# Patient Record
Sex: Female | Born: 1995
Health system: Southern US, Community
[De-identification: ages and names within clinical notes are randomized; demographics above are authoritative.]

## PROBLEM LIST (undated history)

## (undated) DIAGNOSIS — K219 Gastro-esophageal reflux disease without esophagitis: Secondary | ICD-10-CM

## (undated) DIAGNOSIS — L709 Acne, unspecified: Secondary | ICD-10-CM

## (undated) DIAGNOSIS — J45909 Unspecified asthma, uncomplicated: Secondary | ICD-10-CM

## (undated) DIAGNOSIS — M419 Scoliosis, unspecified: Secondary | ICD-10-CM

## (undated) HISTORY — DX: Gastro-esophageal reflux disease without esophagitis: K21.9

## (undated) HISTORY — DX: Scoliosis, unspecified: M41.9

## (undated) HISTORY — PX: TOOTH EXTRACTION: SHX859

## (undated) HISTORY — PX: TYMPANOSTOMY TUBE PLACEMENT: SHX32

## (undated) HISTORY — DX: Unspecified asthma, uncomplicated: J45.909

---
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
## 2014-03-09 ENCOUNTER — Telehealth: Payer: Self-pay

## 2014-03-09 NOTE — Telephone Encounter (Signed)
Left message for call back Non-identifiable   Pap Flu Td- 07/12/07

## 2014-03-10 ENCOUNTER — Encounter: Payer: Self-pay | Admitting: Family Medicine

## 2014-03-10 ENCOUNTER — Ambulatory Visit (INDEPENDENT_AMBULATORY_CARE_PROVIDER_SITE_OTHER): Payer: 59 | Admitting: Family Medicine

## 2014-03-10 VITALS — BP 106/68 | HR 97 | Temp 98.1°F | Ht 64.5 in | Wt 131.0 lb

## 2014-03-10 DIAGNOSIS — J45990 Exercise induced bronchospasm: Secondary | ICD-10-CM

## 2014-03-10 DIAGNOSIS — R7989 Other specified abnormal findings of blood chemistry: Secondary | ICD-10-CM

## 2014-03-10 DIAGNOSIS — Z0000 Encounter for general adult medical examination without abnormal findings: Secondary | ICD-10-CM

## 2014-03-10 LAB — POCT URINALYSIS DIPSTICK
BILIRUBIN UA: NEGATIVE
Glucose, UA: NEGATIVE
KETONES UA: NEGATIVE
Leukocytes, UA: NEGATIVE
Nitrite, UA: NEGATIVE
PH UA: 8
Protein, UA: NEGATIVE
RBC UA: NEGATIVE
SPEC GRAV UA: 1.01
Urobilinogen, UA: 0.2

## 2014-03-10 LAB — RPR

## 2014-03-10 MED ORDER — ALBUTEROL SULFATE HFA 108 (90 BASE) MCG/ACT IN AERS: 2.0000 | INHALATION_SPRAY | Freq: Four times a day (QID) | RESPIRATORY_TRACT | Status: DC | PRN

## 2014-03-10 MED ORDER — BECLOMETHASONE DIPROPIONATE 40 MCG/ACT IN AERS: 2.0000 | INHALATION_SPRAY | Freq: Two times a day (BID) | RESPIRATORY_TRACT | Status: DC

## 2014-03-10 NOTE — Progress Notes (Signed)
  Subjective:     Haley Combs is a 18 y.o. female and is here for a comprehensive physical exam. The patient reports no problems.  History   Social History  . Marital Status: Single    Spouse Name: N/A    Number of Children: 0  . Years of Education: N/A   Occupational History  . Student      [employer]   Social History Main Topics  . Smoking status: Never Smoker   . Smokeless tobacco: Never Used  . Alcohol Use: No  . Drug Use: No  . Sexual Activity: Not Currently    Partners: Male    Birth Control/ Protection: Condom, Pill   Other Topics Concern  . Not on file   Social History Narrative   Exercise-- y 3days a week   Rare caffeine    Health Maintenance  Topic Date Due  . Pap Smear  03/10/2016 (Originally 01/20/2013)  . Tetanus/tdap  03/10/2017 (Originally 01/20/2014)  . Influenza Vaccine  06/20/2014    The following portions of the patient's history were reviewed and updated as appropriate: allergies, current medications, past family history, past medical history, past social history, past surgical history and problem list.  Review of Systems Review of Systems  Constitutional: Negative for activity change, appetite change and fatigue.  HENT: Negative for hearing loss, congestion, tinnitus and ear discharge.  dentist q484m Eyes: Negative for visual disturbance (see optho q1y -- vision corrected to 20/20 with glasses).  Respiratory: Negative for cough, chest tightness and shortness of breath.   Cardiovascular: Negative for chest pain, palpitations and leg swelling.  Gastrointestinal: Negative for abdominal pain, diarrhea, constipation and abdominal distention.  Genitourinary: Negative for urgency, frequency, decreased urine volume and difficulty urinating.  Musculoskeletal: Negative for back pain, arthralgias and gait problem.  Skin: Negative for color change, pallor and rash.  Neurological: Negative for dizziness, light-headedness, numbness and headaches.   Hematological: Negative for adenopathy. Does not bruise/bleed easily.  Psychiatric/Behavioral: Negative for suicidal ideas, confusion, sleep disturbance, self-injury, dysphoric mood, decreased concentration and agitation.       Objective:    BP 106/68  Pulse 97  Temp(Src) 98.1 F (36.7 C) (Oral)  Ht 5' 4.5" (1.638 m)  Wt 131 lb (59.421 kg)  BMI 22.15 kg/m2  SpO2 96%  LMP 02/22/2014 General appearance: alert, cooperative, appears stated age and no distress Head: Normocephalic, without obvious abnormality, atraumatic Eyes: conjunctivae/corneas clear. PERRL, EOM's intact. Fundi benign. Ears: normal TM's and external ear canals both ears Nose: Nares normal. Septum midline. Mucosa normal. No drainage or sinus tenderness. Throat: lips, mucosa, and tongue normal; teeth and gums normal Neck: no adenopathy, no carotid bruit, no JVD, supple, symmetrical, trachea midline and thyroid not enlarged, symmetric, no tenderness/mass/nodules Back: scoliosis Lungs: clear to auscultation bilaterally Breasts: gyn Heart: regular rate and rhythm, S1, S2 normal, no murmur, click, rub or gallop Abdomen: soft, non-tender; bowel sounds normal; no masses,  no organomegaly Pelvic: gyn Extremities: extremities normal, atraumatic, no cyanosis or edema Pulses: 2+ and symmetric Skin: Skin color, texture, turgor normal. No rashes or lesions Lymph nodes: Cervical, supraclavicular, and axillary nodes normal. Neurologic: Alert and oriented X 3, normal strength and tone. Normal symmetric reflexes. Normal coordination and gait Psych-- no depression, no anxiety      Assessment:    Healthy female exam.     Plan:     ghm utd  Check labs  See After Visit Summary for Counseling Recommendations

## 2014-03-10 NOTE — Patient Instructions (Signed)

## 2014-03-10 NOTE — Progress Notes (Signed)
Pre visit review using our clinic review tool, if applicable. No additional management support is needed unless otherwise documented below in the visit note. 

## 2014-03-11 ENCOUNTER — Encounter: Payer: Self-pay | Admitting: Family Medicine

## 2014-03-11 LAB — TSH: TSH: 1.29 u[IU]/mL (ref 0.35–5.50)

## 2014-03-11 LAB — GC/CHLAMYDIA PROBE AMP, URINE
CHLAMYDIA, SWAB/URINE, PCR: NEGATIVE
GC PROBE AMP, URINE: NEGATIVE

## 2014-03-11 LAB — CBC WITH DIFFERENTIAL/PLATELET
BASOS PCT: 0.3 % (ref 0.0–3.0)
Basophils Absolute: 0 10*3/uL (ref 0.0–0.1)
EOS ABS: 0.1 10*3/uL (ref 0.0–0.7)
Eosinophils Relative: 1.3 % (ref 0.0–5.0)
HCT: 42 % (ref 36.0–46.0)
Hemoglobin: 14.3 g/dL (ref 12.0–15.0)
LYMPHS ABS: 1.7 10*3/uL (ref 0.7–4.0)
Lymphocytes Relative: 25 % (ref 12.0–46.0)
MCHC: 33.9 g/dL (ref 30.0–36.0)
MCV: 93.2 fl (ref 78.0–100.0)
MONO ABS: 0.3 10*3/uL (ref 0.1–1.0)
Monocytes Relative: 4.2 % (ref 3.0–12.0)
NEUTROS PCT: 69.2 % (ref 43.0–77.0)
Neutro Abs: 4.7 10*3/uL (ref 1.4–7.7)
PLATELETS: 318 10*3/uL (ref 150.0–400.0)
RBC: 4.51 Mil/uL (ref 3.87–5.11)
RDW: 13.3 % (ref 11.5–14.6)
WBC: 6.8 10*3/uL (ref 4.5–10.5)

## 2014-03-11 LAB — LIPID PANEL
CHOL/HDL RATIO: 4
CHOLESTEROL: 185 mg/dL (ref 0–200)
HDL: 49.3 mg/dL (ref >39.00)
LDL Cholesterol: 115 mg/dL — ABNORMAL HIGH (ref 0–99)
TRIGLYCERIDES: 104 mg/dL (ref 0.0–149.0)
VLDL: 20.8 mg/dL (ref 0.0–40.0)

## 2014-03-11 LAB — BASIC METABOLIC PANEL
BUN: 12 mg/dL (ref 6–23)
CHLORIDE: 104 meq/L (ref 96–112)
CO2: 25 meq/L (ref 19–32)
CREATININE: 1 mg/dL (ref 0.4–1.2)
Calcium: 9.4 mg/dL (ref 8.4–10.5)
GFR: 75.8 mL/min (ref >60.00)
Glucose, Bld: 78 mg/dL (ref 70–99)
Potassium: 3.9 mEq/L (ref 3.5–5.1)
Sodium: 137 mEq/L (ref 135–145)

## 2014-03-11 LAB — HEPATIC FUNCTION PANEL
ALT: 17 U/L (ref 0–35)
AST: 23 U/L (ref 0–37)
Albumin: 4.3 g/dL (ref 3.5–5.2)
Alkaline Phosphatase: 36 U/L — ABNORMAL LOW (ref 39–117)
BILIRUBIN DIRECT: 0 mg/dL (ref 0.0–0.3)
TOTAL PROTEIN: 7.6 g/dL (ref 6.0–8.3)
Total Bilirubin: 0.6 mg/dL (ref 0.3–1.2)

## 2014-03-11 LAB — TESTOSTERONE, FREE, TOTAL, SHBG
Sex Hormone Binding: 69 nmol/L (ref 18–114)
TESTOSTERONE: 54 ng/dL — AB (ref 15–40)
Testosterone, Free: 5.9 pg/mL (ref 0.6–6.8)
Testosterone-% Free: 1.1 % (ref 0.4–2.4)

## 2014-03-11 LAB — HSV 2 ANTIBODY, IGG: HSV 2 GLYCOPROTEIN G AB, IGG: 0.25 IV

## 2014-03-11 LAB — HIV ANTIBODY (ROUTINE TESTING W REFLEX): HIV 1&2 Ab, 4th Generation: NONREACTIVE

## 2014-03-16 NOTE — Telephone Encounter (Signed)
Unable to reach pre visit.  

## 2015-02-23 ENCOUNTER — Telehealth: Payer: Self-pay | Admitting: Family Medicine

## 2015-02-23 NOTE — Telephone Encounter (Signed)
Pre visit letter sent  °

## 2015-03-12 ENCOUNTER — Emergency Department (HOSPITAL_BASED_OUTPATIENT_CLINIC_OR_DEPARTMENT_OTHER): Payer: 59

## 2015-03-12 ENCOUNTER — Encounter (HOSPITAL_BASED_OUTPATIENT_CLINIC_OR_DEPARTMENT_OTHER): Payer: Self-pay | Admitting: *Deleted

## 2015-03-12 ENCOUNTER — Emergency Department (HOSPITAL_BASED_OUTPATIENT_CLINIC_OR_DEPARTMENT_OTHER)
Admission: EM | Admit: 2015-03-12 | Discharge: 2015-03-12 | Disposition: A | Discharge status: Home / Self Care | Payer: 59 | Attending: Emergency Medicine | Admitting: Emergency Medicine

## 2015-03-12 ENCOUNTER — Encounter: Payer: 59 | Admitting: Family Medicine

## 2015-03-12 DIAGNOSIS — R2241 Localized swelling, mass and lump, right lower limb: Secondary | ICD-10-CM | POA: Insufficient documentation

## 2015-03-12 DIAGNOSIS — Z872 Personal history of diseases of the skin and subcutaneous tissue: Secondary | ICD-10-CM | POA: Insufficient documentation

## 2015-03-12 DIAGNOSIS — M79661 Pain in right lower leg: Secondary | ICD-10-CM | POA: Insufficient documentation

## 2015-03-12 DIAGNOSIS — Z7952 Long term (current) use of systemic steroids: Secondary | ICD-10-CM | POA: Diagnosis not present

## 2015-03-12 DIAGNOSIS — Z793 Long term (current) use of hormonal contraceptives: Secondary | ICD-10-CM | POA: Insufficient documentation

## 2015-03-12 DIAGNOSIS — Z8719 Personal history of other diseases of the digestive system: Secondary | ICD-10-CM | POA: Diagnosis not present

## 2015-03-12 DIAGNOSIS — M79604 Pain in right leg: Secondary | ICD-10-CM

## 2015-03-12 DIAGNOSIS — J45909 Unspecified asthma, uncomplicated: Secondary | ICD-10-CM | POA: Insufficient documentation

## 2015-03-12 LAB — BASIC METABOLIC PANEL
Anion gap: 8 (ref 5–15)
BUN: 10 mg/dL (ref 6–23)
CHLORIDE: 103 mmol/L (ref 96–112)
CO2: 26 mmol/L (ref 19–32)
CREATININE: 0.8 mg/dL (ref 0.50–1.10)
Calcium: 9.3 mg/dL (ref 8.4–10.5)
GFR calc non Af Amer: >90 mL/min (ref >90)
GLUCOSE: 121 mg/dL — AB (ref 70–99)
POTASSIUM: 3.3 mmol/L — AB (ref 3.5–5.1)
Sodium: 137 mmol/L (ref 135–145)

## 2015-03-12 LAB — CBC WITH DIFFERENTIAL/PLATELET
BASOS ABS: 0 10*3/uL (ref 0.0–0.1)
Basophils Relative: 0 % (ref 0–1)
EOS PCT: 1 % (ref 0–5)
Eosinophils Absolute: 0.1 10*3/uL (ref 0.0–0.7)
HEMATOCRIT: 42.7 % (ref 36.0–46.0)
HEMOGLOBIN: 14.4 g/dL (ref 12.0–15.0)
Lymphocytes Relative: 29 % (ref 12–46)
Lymphs Abs: 1.9 10*3/uL (ref 0.7–4.0)
MCH: 31.2 pg (ref 26.0–34.0)
MCHC: 33.7 g/dL (ref 30.0–36.0)
MCV: 92.6 fL (ref 78.0–100.0)
MONO ABS: 0.4 10*3/uL (ref 0.1–1.0)
MONOS PCT: 6 % (ref 3–12)
NEUTROS ABS: 4.3 10*3/uL (ref 1.7–7.7)
Neutrophils Relative %: 64 % (ref 43–77)
Platelets: 286 10*3/uL (ref 150–400)
RBC: 4.61 MIL/uL (ref 3.87–5.11)
RDW: 12.5 % (ref 11.5–15.5)
WBC: 6.6 10*3/uL (ref 4.0–10.5)

## 2015-03-12 NOTE — ED Notes (Signed)
Patient transported to Ultrasound 

## 2015-03-12 NOTE — ED Notes (Signed)
[facility]

## 2015-03-12 NOTE — Discharge Instructions (Signed)
Pain of Unknown Etiology (Pain Without a Known Cause) °You have come to your caregiver because of pain. Pain can occur in any part of the body. Often there is not a definite cause. If your laboratory (blood or urine) work was normal and X-rays or other studies were normal, your caregiver may treat you without knowing the cause of the pain. An example of this is the headache. Most headaches are diagnosed by taking a history. This means your caregiver asks you questions about your headaches. Your caregiver determines a treatment based on your answers. Usually testing done for headaches is normal. Often testing is not done unless there is no response to medications. Regardless of where your pain is located today, you can be given medications to make you comfortable. If no physical cause of pain can be found, most cases of pain will gradually leave as suddenly as they came.  °If you have a painful condition and no reason can be found for the pain, it is important that you follow up with your caregiver. If the pain becomes worse or does not go away, it may be necessary to repeat tests and look further for a possible cause. °· Only take over-the-counter or prescription medicines for pain, discomfort, or fever as directed by your caregiver. °· For the protection of your privacy, test results cannot be given over the phone. Make sure you receive the results of your test. Ask how these results are to be obtained if you have not been informed. It is your responsibility to obtain your test results. °· You may continue all activities unless the activities cause more pain. When the pain lessens, it is important to gradually resume normal activities. Resume activities by beginning slowly and gradually increasing the intensity and duration of the activities or exercise. During periods of severe pain, bed rest may be helpful. Lie or sit in any position that is comfortable. °· Ice used for acute (sudden) conditions may be effective.  Use a large plastic bag filled with ice and wrapped in a towel. This may provide pain relief. °· See your caregiver for continued problems. Your caregiver can help or refer you for exercises or physical therapy if necessary. °If you were given medications for your condition, do not drive, operate machinery or power tools, or sign legal documents for 24 hours. Do not drink alcohol, take sleeping pills, or take other medications that may interfere with treatment. °See your caregiver immediately if you have pain that is becoming worse and not relieved by medications. °Document Released: 08/01/2001 Document Revised: 08/27/2013 Document Reviewed: 11/06/2005 °ExitCare® Patient Information ©2015 [employer]. This information is not intended to replace advice given to you by your health care provider. Make sure you discuss any questions you have with your health care provider. ° °

## 2015-03-12 NOTE — ED Provider Notes (Signed)
CSN: [id number]     Arrival date & time 03/12/15  1803 History   First MD Initiated Contact with Patient 03/12/15 1821     Chief Complaint  Patient presents with  . Leg Pain     (Consider location/radiation/quality/duration/timing/severity/associated sxs/prior Treatment) HPI Comments: Pt comes in with r/o right lateral lower leg pain. Pt states that she has noticed swelling to the area. Denies injury. Nothing makes the symptoms worse. Pt is on birth control. She was seen [facility] and sent over to r/o dvt. No sob or cp. No previous injury to the area. No family history of clotting disorder  No language interpreter was used.    Past Medical History  Diagnosis Date  . Acne   . Asthma     Weather Induced heat/cold  . GERD (gastroesophageal reflux disease)   . Scoliosis    Past Surgical History  Procedure Laterality Date  . Tooth extraction    . Tympanostomy tube placement     Family History  Problem Relation Age of Onset  . Hypertension Father     Not Medicated  . Hyperlipidemia Father   . Alcohol abuse Sister   . Drug abuse Sister   . Alcohol abuse Paternal Uncle   . Mental illness Paternal Uncle     Schizophrania  . Hypertension Paternal Grandmother   . Hypertension Paternal Grandfather   . Stroke Paternal Grandfather   . Heart disease Paternal Grandfather     Pacemaker, Ruptured Aorta  . Colon cancer Neg Hx   . Colon polyps Neg Hx   . Dementia Maternal Grandmother    History  Substance Use Topics  . Smoking status: Never Smoker   . Smokeless tobacco: Never Used  . Alcohol Use: No   OB History    No data available     Review of Systems  All other systems reviewed and are negative.     Allergies  Review of patient's allergies indicates no known allergies.  Home Medications   Prior to Admission medications   Medication Sig Start Date End Date Taking? Authorizing Provider  albuterol (PROAIR HFA) 108 (90 BASE) MCG/ACT inhaler Inhale 2 puffs into the  lungs every 6 (six) hours as needed for wheezing or shortness of breath. 03/10/14   Lelon Perla, DO  beclomethasone (QVAR) 40 MCG/ACT inhaler Inhale 2 puffs into the lungs 2 (two) times daily. 03/10/14   Yvonne R Lowne, DO  LEVORA 0.15/30, 28, 0.15-30 MG-MCG tablet Take 1 tablet by mouth daily. 01/27/13   Historical Provider, MD   BP 123/84 mmHg  Pulse 106  Temp(Src) 98.2 F (36.8 C) (Oral)  Resp 18  Ht  (1.651 m)  Wt 130 lb (58.968 kg)  BMI 21.63 kg/m2  SpO2 98%  LMP 02/26/2015 Physical Exam  Constitutional: She is oriented to person, place, and time. She appears well-developed and well-nourished.  Cardiovascular: Normal rate.   Pulmonary/Chest: Effort normal and breath sounds normal.  Musculoskeletal: Normal range of motion.  Mild swelling noted to the right lower lateral leg. No redness or warmth noted. Full rom. Tender to palpation.  Neurological: She is alert and oriented to person, place, and time. She exhibits normal muscle tone.  Skin: Skin is warm and dry.  Psychiatric: She has a normal mood and affect.  Nursing note and vitals reviewed.   ED Course  Procedures (including critical care time) Labs Review Labs Reviewed  BASIC METABOLIC PANEL - Abnormal; Notable for the following:    Potassium 3.3 (*)  Glucose, Bld 121 (*)    All other components within normal limits  CBC WITH DIFFERENTIAL/PLATELET    Imaging Review Dg Tibia/fibula Right  03/12/2015   CLINICAL DATA:  Right lower leg pain and swelling for 2 days. No known injury.  EXAM: RIGHT TIBIA AND FIBULA - 2 VIEW  COMPARISON:  None.  FINDINGS: There is no evidence of fracture or other focal bone lesions. Soft tissues are unremarkable.  IMPRESSION: Normal exam.   Electronically Signed   By: Francene BoyersJames  Maxwell M.D.   On: 03/12/2015 19:35   [place] Venous Img Lower Unilateral Right  03/12/2015   CLINICAL DATA:  Pain and swelling of the right lateral lower calf, erythema  EXAM: Right LOWER EXTREMITY VENOUS DOPPLER  ULTRASOUND  TECHNIQUE: Gray-scale sonography with graded compression, as well as color Doppler and duplex ultrasound were performed to evaluate the lower extremity deep venous systems from the level of the common femoral vein and including the common femoral, femoral, profunda femoral, popliteal and calf veins including the posterior tibial, peroneal and gastrocnemius veins when visible. The superficial great saphenous vein was also interrogated. Spectral Doppler was utilized to evaluate flow at rest and with distal augmentation maneuvers in the common femoral, femoral and popliteal veins.  COMPARISON:  None.  FINDINGS: Contralateral Common Femoral Vein: Respiratory phasicity is normal and symmetric with the symptomatic side. No evidence of thrombus. Normal compressibility.  Common Femoral Vein: No evidence of thrombus. Normal compressibility, respiratory phasicity and response to augmentation.  Saphenofemoral Junction: No evidence of thrombus. Normal compressibility and flow on color Doppler imaging.  Profunda Femoral Vein: No evidence of thrombus. Normal compressibility and flow on color Doppler imaging.  Femoral Vein: No evidence of thrombus. Normal compressibility, respiratory phasicity and response to augmentation.  Popliteal Vein: No evidence of thrombus. Normal compressibility, respiratory phasicity and response to augmentation.  Calf Veins: No evidence of thrombus. Normal compressibility and flow on color Doppler imaging.  Superficial Great Saphenous Vein: No evidence of thrombus. Normal compressibility and flow on color Doppler imaging.  Venous Reflux:  None.  Other Findings:  None.  IMPRESSION: No evidence of deep venous thrombosis.   Electronically Signed   By: Christiana PellantGretchen  Green M.D.   On: 03/12/2015 19:30     EKG Interpretation None      MDM   Final diagnoses:  Pain of right lower extremity    No definite explanation for the symptoms. Pt instruction on taking ibuprofen and follow up with her  doctor as needed.    Teressa LowerVrinda Brandace Cargle, NP 03/12/15 1951  Gwyneth SproutWhitney Plunkett, MD 03/12/15 [phone]

## 2015-03-12 NOTE — ED Notes (Signed)
[facility]. She has pain and swelling to her right lower leg.

## 2015-03-15 ENCOUNTER — Ambulatory Visit (INDEPENDENT_AMBULATORY_CARE_PROVIDER_SITE_OTHER): Payer: 59 | Admitting: Family Medicine

## 2015-03-15 ENCOUNTER — Encounter: Payer: Self-pay | Admitting: Family Medicine

## 2015-03-15 VITALS — BP 116/70 | HR 89 | Temp 98.0°F | Resp 18 | Ht 64.75 in | Wt 129.0 lb

## 2015-03-15 DIAGNOSIS — R739 Hyperglycemia, unspecified: Secondary | ICD-10-CM | POA: Diagnosis not present

## 2015-03-15 DIAGNOSIS — Z0000 Encounter for general adult medical examination without abnormal findings: Secondary | ICD-10-CM | POA: Diagnosis not present

## 2015-03-15 MED ORDER — NONFORMULARY OR COMPOUNDED ITEM: Status: DC

## 2015-03-15 NOTE — Progress Notes (Signed)
Subjective:     Haley Combs is a 19 y.o. female and is here for a comprehensive physical exam. The patient reports no problems.  History   Social History  . Marital Status: Single    Spouse Name: N/A  . Number of Children: 0  . Years of Education: N/A   Occupational History  . Student      [employer]   Social History Main Topics  . Smoking status: Never Smoker   . Smokeless tobacco: Never Used  . Alcohol Use: No  . Drug Use: No  . Sexual Activity:    Partners: Male    Birth Control/ Protection: Condom, Pill   Other Topics Concern  . Not on file   Social History Narrative   Exercise-- y 3days a week   Rare caffeine    Health Maintenance  Topic Date Due  . PAP SMEAR  03/10/2016 (Originally 01/20/2013)  . TETANUS/TDAP  03/10/2017 (Originally 01/20/2014)  . INFLUENZA VACCINE  06/21/2015  . HIV Screening  Completed    The following portions of the patient's history were reviewed and updated as appropriate:  She  has a past medical history of Acne; Asthma; GERD (gastroesophageal reflux disease); and Scoliosis. She  does not have any pertinent problems on file. She  has past surgical history that includes Tooth Extraction and Tympanostomy tube placement. Her family history includes Alcohol abuse in her paternal uncle and sister; Dementia in her maternal grandmother; Drug abuse in her sister; Heart disease in her paternal grandfather; Hyperlipidemia in her father; Hypertension in her father, paternal grandfather, and paternal grandmother; Mental illness in her paternal uncle; Stroke in her paternal grandfather. There is no history of Colon cancer or Colon polyps. She  reports that she has never smoked. She has never used smokeless tobacco. She reports that she does not drink alcohol or use illicit drugs. She has a current medication list which includes the following prescription(s): albuterol, levora 0.15/30 (28), beclomethasone, and NONFORMULARY OR COMPOUNDED ITEM. Current  Outpatient Prescriptions on File Prior to Visit  Medication Sig Dispense Refill  . albuterol (PROAIR HFA) 108 (90 BASE) MCG/ACT inhaler Inhale 2 puffs into the lungs every 6 (six) hours as needed for wheezing or shortness of breath. 1 Inhaler 5  . LEVORA 0.15/30, 28, 0.15-30 MG-MCG tablet Take 1 tablet by mouth daily.    . beclomethasone (QVAR) 40 MCG/ACT inhaler Inhale 2 puffs into the lungs 2 (two) times daily. (Patient not taking: Reported on 03/15/2015) 1 Inhaler 12   No current facility-administered medications on file prior to visit.   She has No Known Allergies..  Review of Systems Review of Systems  Constitutional: Negative for activity change, appetite change and fatigue.  HENT: Negative for hearing loss, congestion, tinnitus and ear discharge.  dentist [id number] Eyes: Negative for visual disturbance (see optho q1y -- vision corrected to 20/20 with glasses).  Respiratory: Negative for cough, chest tightness and shortness of breath.   Cardiovascular: Negative for chest pain, palpitations and leg swelling.  Gastrointestinal: Negative for abdominal pain, diarrhea, constipation and abdominal distention.  Genitourinary: Negative for urgency, frequency, decreased urine volume and difficulty urinating.  Musculoskeletal: Negative for back pain, arthralgias and gait problem.  Skin: Negative for color change, pallor and rash.  Neurological: Negative for dizziness, light-headedness, numbness and headaches.  Hematological: Negative for adenopathy. Does not bruise/bleed easily.  Psychiatric/Behavioral: Negative for suicidal ideas, confusion, sleep disturbance, self-injury, dysphoric mood, decreased concentration and agitation.       Objective:  BP 116/70 mmHg  Pulse 89  Temp(Src) 98 F (36.7 C) (Oral)  Resp 18  Ht 5' 4.75" (1.645 m)  Wt 129 lb (58.514 kg)  BMI 21.62 kg/m2  SpO2 99%  LMP 02/26/2015 General appearance: alert, cooperative, appears stated age and no distress Head:  Normocephalic, without obvious abnormality, atraumatic Eyes: conjunctivae/corneas clear. PERRL, EOM's intact. Fundi benign. Ears: normal TM's and external ear canals both ears Nose: Nares normal. Septum midline. Mucosa normal. No drainage or sinus tenderness. Throat: lips, mucosa, and tongue normal; teeth and gums normal Neck: no adenopathy, no carotid bruit, no JVD, supple, symmetrical, trachea midline and thyroid not enlarged, symmetric, no tenderness/mass/nodules Back: symmetric, no curvature. ROM normal. No CVA tenderness. Lungs: clear to auscultation bilaterally Breasts: gyn Heart: regular rate and rhythm, S1, S2 normal, no murmur, click, rub or gallop Abdomen: soft, non-tender; bowel sounds normal; no masses,  no organomegaly Pelvic: deferred--gyn Extremities: extremities normal, atraumatic, no cyanosis or edema Pulses: 2+ and symmetric Skin: Skin color, texture, turgor normal. No rashes or lesions Lymph nodes: Cervical, supraclavicular, and axillary nodes normal. Neurologic: Alert and oriented X 3, normal strength and tone. Normal symmetric reflexes. Normal coordination and gait Psych- no depression, no anxiety      Assessment:    Healthy female exam.      Plan:    gyn for pap ghm utd Check labs See After Visit Summary for Counseling Recommendations

## 2015-03-15 NOTE — Patient Instructions (Signed)
Preventive Care for Adults A healthy lifestyle and preventive care can promote health and wellness. Preventive health guidelines for women include the following key practices.  A routine yearly physical is a good way to check with your health care provider about your health and preventive screening. It is a chance to share any concerns and updates on your health and to receive a thorough exam.  Visit your dentist for a routine exam and preventive care every 6 months. Brush your teeth twice a day and floss once a day. Good oral hygiene prevents tooth decay and gum disease.  The frequency of eye exams is based on your age, health, family medical history, use of contact lenses, and other factors. Follow your health care provider's recommendations for frequency of eye exams.  Eat a healthy diet. Foods like vegetables, fruits, whole grains, low-fat dairy products, and lean protein foods contain the nutrients you need without too many calories. Decrease your intake of foods high in solid fats, added sugars, and salt. Eat the right amount of calories for you.Get information about a proper diet from your health care provider, if necessary.  Regular physical exercise is one of the most important things you can do for your health. Most adults should get at least 150 minutes of moderate-intensity exercise (any activity that increases your heart rate and causes you to sweat) each week. In addition, most adults need muscle-strengthening exercises on 2 or more days a week.  Maintain a healthy weight. The body mass index (BMI) is a screening tool to identify possible weight problems. It provides an estimate of body fat based on height and weight. Your health care provider can find your BMI and can help you achieve or maintain a healthy weight.For adults 20 years and older:  A BMI below 18.5 is considered underweight.  A BMI of 18.5 to 24.9 is normal.  A BMI of 25 to 29.9 is considered overweight.  A BMI of  30 and above is considered obese.  Maintain normal blood lipids and cholesterol levels by exercising and minimizing your intake of saturated fat. Eat a balanced diet with plenty of fruit and vegetables. Blood tests for lipids and cholesterol should begin at age 76 and be repeated every 5 years. If your lipid or cholesterol levels are high, you are over 50, or you are at high risk for heart disease, you may need your cholesterol levels checked more frequently.Ongoing high lipid and cholesterol levels should be treated with medicines if diet and exercise are not working.  If you smoke, find out from your health care provider how to quit. If you do not use tobacco, do not start.  Lung cancer screening is recommended for adults aged 22-80 years who are at high risk for developing lung cancer because of a history of smoking. A yearly low-dose CT scan of the lungs is recommended for people who have at least a 30-pack-year history of smoking and are a current smoker or have quit within the past 15 years. A pack year of smoking is smoking an average of 1 pack of cigarettes a day for 1 year (for example: 1 pack a day for 30 years or 2 packs a day for 15 years). Yearly screening should continue until the smoker has stopped smoking for at least 15 years. Yearly screening should be stopped for people who develop a health problem that would prevent them from having lung cancer treatment.  If you are pregnant, do not drink alcohol. If you are breastfeeding,  be very cautious about drinking alcohol. If you are not pregnant and choose to drink alcohol, do not have more than 1 drink per day. One drink is considered to be 12 ounces (355 mL) of beer, 5 ounces (148 mL) of wine, or 1.5 ounces (44 mL) of liquor.  Avoid use of street drugs. Do not share needles with anyone. Ask for help if you need support or instructions about stopping the use of drugs.  High blood pressure causes heart disease and increases the risk of  stroke. Your blood pressure should be checked at least every 1 to 2 years. Ongoing high blood pressure should be treated with medicines if weight loss and exercise do not work.  If you are 75-52 years old, ask your health care provider if you should take aspirin to prevent strokes.  Diabetes screening involves taking a blood sample to check your fasting blood sugar level. This should be done once every 3 years, after age 15, if you are within normal weight and without risk factors for diabetes. Testing should be considered at a younger age or be carried out more frequently if you are overweight and have at least 1 risk factor for diabetes.  Breast cancer screening is essential preventive care for women. You should practice "breast self-awareness." This means understanding the normal appearance and feel of your breasts and may include breast self-examination. Any changes detected, no matter how small, should be reported to a health care provider. Women in their 58s and 30s should have a clinical breast exam (CBE) by a health care provider as part of a regular health exam every 1 to 3 years. After age 16, women should have a CBE every year. Starting at age 53, women should consider having a mammogram (breast X-ray test) every year. Women who have a family history of breast cancer should talk to their health care provider about genetic screening. Women at a high risk of breast cancer should talk to their health care providers about having an MRI and a mammogram every year.  Breast cancer gene (BRCA)-related cancer risk assessment is recommended for women who have family members with BRCA-related cancers. BRCA-related cancers include breast, ovarian, tubal, and peritoneal cancers. Having family members with these cancers may be associated with an increased risk for harmful changes (mutations) in the breast cancer genes BRCA1 and BRCA2. Results of the assessment will determine the need for genetic counseling and  BRCA1 and BRCA2 testing.  Routine pelvic exams to screen for cancer are no longer recommended for nonpregnant women who are considered low risk for cancer of the pelvic organs (ovaries, uterus, and vagina) and who do not have symptoms. Ask your health care provider if a screening pelvic exam is right for you.  If you have had past treatment for cervical cancer or a condition that could lead to cancer, you need Pap tests and screening for cancer for at least 20 years after your treatment. If Pap tests have been discontinued, your risk factors (such as having a new sexual partner) need to be reassessed to determine if screening should be resumed. Some women have medical problems that increase the chance of getting cervical cancer. In these cases, your health care provider may recommend more frequent screening and Pap tests.  The HPV test is an additional test that may be used for cervical cancer screening. The HPV test looks for the virus that can cause the cell changes on the cervix. The cells collected during the Pap test can be  tested for HPV. The HPV test could be used to screen women aged 30 years and older, and should be used in women of any age who have unclear Pap test results. After the age of 30, women should have HPV testing at the same frequency as a Pap test.  Colorectal cancer can be detected and often prevented. Most routine colorectal cancer screening begins at the age of 50 years and continues through age 75 years. However, your health care provider may recommend screening at an earlier age if you have risk factors for colon cancer. On a yearly basis, your health care provider may provide home test kits to check for hidden blood in the stool. Use of a small camera at the end of a tube, to directly examine the colon (sigmoidoscopy or colonoscopy), can detect the earliest forms of colorectal cancer. Talk to your health care provider about this at age 50, when routine screening begins. Direct  exam of the colon should be repeated every 5-10 years through age 75 years, unless early forms of pre-cancerous polyps or small growths are found.  People who are at an increased risk for hepatitis B should be screened for this virus. You are considered at high risk for hepatitis B if:  You were born in a country where hepatitis B occurs often. Talk with your health care provider about which countries are considered high risk.  Your parents were born in a high-risk country and you have not received a shot to protect against hepatitis B (hepatitis B vaccine).  You have HIV or AIDS.  You use needles to inject street drugs.  You live with, or have sex with, someone who has hepatitis B.  You get hemodialysis treatment.  You take certain medicines for conditions like cancer, organ transplantation, and autoimmune conditions.  Hepatitis C blood testing is recommended for all people born from 1945 through 1965 and any individual with known risks for hepatitis C.  Practice safe sex. Use condoms and avoid high-risk sexual practices to reduce the spread of sexually transmitted infections (STIs). STIs include gonorrhea, chlamydia, syphilis, trichomonas, herpes, HPV, and human immunodeficiency virus (HIV). Herpes, HIV, and HPV are viral illnesses that have no cure. They can result in disability, cancer, and death.  You should be screened for sexually transmitted illnesses (STIs) including gonorrhea and chlamydia if:  You are sexually active and are younger than 24 years.  You are older than 24 years and your health care provider tells you that you are at risk for this type of infection.  Your sexual activity has changed since you were last screened and you are at an increased risk for chlamydia or gonorrhea. Ask your health care provider if you are at risk.  If you are at risk of being infected with HIV, it is recommended that you take a prescription medicine daily to prevent HIV infection. This is  called preexposure prophylaxis (PrEP). You are considered at risk if:  You are a heterosexual woman, are sexually active, and are at increased risk for HIV infection.  You take drugs by injection.  You are sexually active with a partner who has HIV.  Talk with your health care provider about whether you are at high risk of being infected with HIV. If you choose to begin PrEP, you should first be tested for HIV. You should then be tested every 3 months for as long as you are taking PrEP.  Osteoporosis is a disease in which the bones lose minerals and strength   with aging. This can result in serious bone fractures or breaks. The risk of osteoporosis can be identified using a bone density scan. Women ages 65 years and over and women at risk for fractures or osteoporosis should discuss screening with their health care providers. Ask your health care provider whether you should take a calcium supplement or vitamin D to reduce the rate of osteoporosis.  Menopause can be associated with physical symptoms and risks. Hormone replacement therapy is available to decrease symptoms and risks. You should talk to your health care provider about whether hormone replacement therapy is right for you.  Use sunscreen. Apply sunscreen liberally and repeatedly throughout the day. You should seek shade when your shadow is shorter than you. Protect yourself by wearing long sleeves, pants, a wide-brimmed hat, and sunglasses year round, whenever you are outdoors.  Once a month, do a whole body skin exam, using a mirror to look at the skin on your back. Tell your health care provider of new moles, moles that have irregular borders, moles that are larger than a pencil eraser, or moles that have changed in shape or color.  Stay current with required vaccines (immunizations).  Influenza vaccine. All adults should be immunized every year.  Tetanus, diphtheria, and acellular pertussis (Td, Tdap) vaccine. Pregnant women should  receive 1 dose of Tdap vaccine during each pregnancy. The dose should be obtained regardless of the length of time since the last dose. Immunization is preferred during the 27th-36th week of gestation. An adult who has not previously received Tdap or who does not know her vaccine status should receive 1 dose of Tdap. This initial dose should be followed by tetanus and diphtheria toxoids (Td) booster doses every 10 years. Adults with an unknown or incomplete history of completing a 3-dose immunization series with Td-containing vaccines should begin or complete a primary immunization series including a Tdap dose. Adults should receive a Td booster every 10 years.  Varicella vaccine. An adult without evidence of immunity to varicella should receive 2 doses or a second dose if she has previously received 1 dose. Pregnant females who do not have evidence of immunity should receive the first dose after pregnancy. This first dose should be obtained before leaving the health care facility. The second dose should be obtained 4-8 weeks after the first dose.  Human papillomavirus (HPV) vaccine. Females aged 13-26 years who have not received the vaccine previously should obtain the 3-dose series. The vaccine is not recommended for use in pregnant females. However, pregnancy testing is not needed before receiving a dose. If a female is found to be pregnant after receiving a dose, no treatment is needed. In that case, the remaining doses should be delayed until after the pregnancy. Immunization is recommended for any person with an immunocompromised condition through the age of 26 years if she did not get any or all doses earlier. During the 3-dose series, the second dose should be obtained 4-8 weeks after the first dose. The third dose should be obtained 24 weeks after the first dose and 16 weeks after the second dose.  Zoster vaccine. One dose is recommended for adults aged 60 years or older unless certain conditions are  present.  Measles, mumps, and rubella (MMR) vaccine. Adults born before 1957 generally are considered immune to measles and mumps. Adults born in 1957 or later should have 1 or more doses of MMR vaccine unless there is a contraindication to the vaccine or there is laboratory evidence of immunity to   each of the three diseases. A routine second dose of MMR vaccine should be obtained at least 28 days after the first dose for students attending postsecondary schools, health care workers, or international travelers. People who received inactivated measles vaccine or an unknown type of measles vaccine during 1963-1967 should receive 2 doses of MMR vaccine. People who received inactivated mumps vaccine or an unknown type of mumps vaccine before 1979 and are at high risk for mumps infection should consider immunization with 2 doses of MMR vaccine. For females of childbearing age, rubella immunity should be determined. If there is no evidence of immunity, females who are not pregnant should be vaccinated. If there is no evidence of immunity, females who are pregnant should delay immunization until after pregnancy. Unvaccinated health care workers born before 1957 who lack laboratory evidence of measles, mumps, or rubella immunity or laboratory confirmation of disease should consider measles and mumps immunization with 2 doses of MMR vaccine or rubella immunization with 1 dose of MMR vaccine.  Pneumococcal 13-valent conjugate (PCV13) vaccine. When indicated, a person who is uncertain of her immunization history and has no record of immunization should receive the PCV13 vaccine. An adult aged 19 years or older who has certain medical conditions and has not been previously immunized should receive 1 dose of PCV13 vaccine. This PCV13 should be followed with a dose of pneumococcal polysaccharide (PPSV23) vaccine. The PPSV23 vaccine dose should be obtained at least 8 weeks after the dose of PCV13 vaccine. An adult aged 19  years or older who has certain medical conditions and previously received 1 or more doses of PPSV23 vaccine should receive 1 dose of PCV13. The PCV13 vaccine dose should be obtained 1 or more years after the last PPSV23 vaccine dose.  Pneumococcal polysaccharide (PPSV23) vaccine. When PCV13 is also indicated, PCV13 should be obtained first. All adults aged 65 years and older should be immunized. An adult younger than age 65 years who has certain medical conditions should be immunized. Any person who resides in a nursing home or long-term care facility should be immunized. An adult smoker should be immunized. People with an immunocompromised condition and certain other conditions should receive both PCV13 and PPSV23 vaccines. People with human immunodeficiency virus (HIV) infection should be immunized as soon as possible after diagnosis. Immunization during chemotherapy or radiation therapy should be avoided. Routine use of PPSV23 vaccine is not recommended for American Indians, [place] Natives, or people younger than 65 years unless there are medical conditions that require PPSV23 vaccine. When indicated, people who have unknown immunization and have no record of immunization should receive PPSV23 vaccine. One-time revaccination 5 years after the first dose of PPSV23 is recommended for people aged 19-64 years who have chronic kidney failure, nephrotic syndrome, asplenia, or immunocompromised conditions. People who received 1-2 doses of PPSV23 before age 65 years should receive another dose of PPSV23 vaccine at age 65 years or later if at least 5 years have passed since the previous dose. Doses of PPSV23 are not needed for people immunized with PPSV23 at or after age 65 years.  Meningococcal vaccine. Adults with asplenia or persistent complement component deficiencies should receive 2 doses of quadrivalent meningococcal conjugate (MenACWY-D) vaccine. The doses should be obtained at least 2 months apart.  Microbiologists working with certain meningococcal bacteria, military recruits, people at risk during an outbreak, and people who travel to or live in countries with a high rate of meningitis should be immunized. A first-year college student up through age   21 years who is living in a residence hall should receive a dose if she did not receive a dose on or after her 16th birthday. Adults who have certain high-risk conditions should receive one or more doses of vaccine.  Hepatitis A vaccine. Adults who wish to be protected from this disease, have certain high-risk conditions, work with hepatitis A-infected animals, work in hepatitis A research labs, or travel to or work in countries with a high rate of hepatitis A should be immunized. Adults who were previously unvaccinated and who anticipate close contact with an international adoptee during the first 60 days after arrival in the [place] from a country with a high rate of hepatitis A should be immunized.  Hepatitis B vaccine. Adults who wish to be protected from this disease, have certain high-risk conditions, may be exposed to blood or other infectious body fluids, are household contacts or sex partners of hepatitis B positive people, are clients or workers in certain care facilities, or travel to or work in countries with a high rate of hepatitis B should be immunized.  Haemophilus influenzae type b (Hib) vaccine. A previously unvaccinated person with asplenia or sickle cell disease or having a scheduled splenectomy should receive 1 dose of Hib vaccine. Regardless of previous immunization, a recipient of a hematopoietic stem cell transplant should receive a 3-dose series 6-12 months after her successful transplant. Hib vaccine is not recommended for adults with HIV infection. Preventive Services / Frequency Ages 64 to 68 years  Blood pressure check.** / Every 1 to 2 years.  Lipid and cholesterol check.** / Every 5 years beginning at age  22.  Clinical breast exam.** / Every 3 years for women in their 88s and 53s.  BRCA-related cancer risk assessment.** / For women who have family members with a BRCA-related cancer (breast, ovarian, tubal, or peritoneal cancers).  Pap test.** / Every 2 years from ages 90 through 51. Every 3 years starting at age 21 through age 56 or 3 with a history of 3 consecutive normal Pap tests.  HPV screening.** / Every 3 years from ages 24 through ages 1 to 46 with a history of 3 consecutive normal Pap tests.  Hepatitis C blood test.** / For any individual with known risks for hepatitis C.  Skin self-exam. / Monthly.  Influenza vaccine. / Every year.  Tetanus, diphtheria, and acellular pertussis (Tdap, Td) vaccine.** / Consult your health care provider. Pregnant women should receive 1 dose of Tdap vaccine during each pregnancy. 1 dose of Td every 10 years.  Varicella vaccine.** / Consult your health care provider. Pregnant females who do not have evidence of immunity should receive the first dose after pregnancy.  HPV vaccine. / 3 doses over 6 months, if 72 and younger. The vaccine is not recommended for use in pregnant females. However, pregnancy testing is not needed before receiving a dose.  Measles, mumps, rubella (MMR) vaccine.** / You need at least 1 dose of MMR if you were born in 1957 or later. You may also need a 2nd dose. For females of childbearing age, rubella immunity should be determined. If there is no evidence of immunity, females who are not pregnant should be vaccinated. If there is no evidence of immunity, females who are pregnant should delay immunization until after pregnancy.  Pneumococcal 13-valent conjugate (PCV13) vaccine.** / Consult your health care provider.  Pneumococcal polysaccharide (PPSV23) vaccine.** / 1 to 2 doses if you smoke cigarettes or if you have certain conditions.  Meningococcal vaccine.** /  1 dose if you are age 19 to 21 years and a first-year college  student living in a residence hall, or have one of several medical conditions, you need to get vaccinated against meningococcal disease. You may also need additional booster doses.  Hepatitis A vaccine.** / Consult your health care provider.  Hepatitis B vaccine.** / Consult your health care provider.  Haemophilus influenzae type b (Hib) vaccine.** / Consult your health care provider. Ages 40 to 64 years  Blood pressure check.** / Every 1 to 2 years.  Lipid and cholesterol check.** / Every 5 years beginning at age 20 years.  Lung cancer screening. / Every year if you are aged 55-80 years and have a 30-pack-year history of smoking and currently smoke or have quit within the past 15 years. Yearly screening is stopped once you have quit smoking for at least 15 years or develop a health problem that would prevent you from having lung cancer treatment.  Clinical breast exam.** / Every year after age 40 years.  BRCA-related cancer risk assessment.** / For women who have family members with a BRCA-related cancer (breast, ovarian, tubal, or peritoneal cancers).  Mammogram.** / Every year beginning at age 40 years and continuing for as long as you are in good health. Consult with your health care provider.  Pap test.** / Every 3 years starting at age 30 years through age 65 or 70 years with a history of 3 consecutive normal Pap tests.  HPV screening.** / Every 3 years from ages 30 years through ages 65 to 70 years with a history of 3 consecutive normal Pap tests.  Fecal occult blood test (FOBT) of stool. / Every year beginning at age 50 years and continuing until age 75 years. You may not need to do this test if you get a colonoscopy every 10 years.  Flexible sigmoidoscopy or colonoscopy.** / Every 5 years for a flexible sigmoidoscopy or every 10 years for a colonoscopy beginning at age 50 years and continuing until age 75 years.  Hepatitis C blood test.** / For all people born from 1945 through  1965 and any individual with known risks for hepatitis C.  Skin self-exam. / Monthly.  Influenza vaccine. / Every year.  Tetanus, diphtheria, and acellular pertussis (Tdap/Td) vaccine.** / Consult your health care provider. Pregnant women should receive 1 dose of Tdap vaccine during each pregnancy. 1 dose of Td every 10 years.  Varicella vaccine.** / Consult your health care provider. Pregnant females who do not have evidence of immunity should receive the first dose after pregnancy.  Zoster vaccine.** / 1 dose for adults aged 60 years or older.  Measles, mumps, rubella (MMR) vaccine.** / You need at least 1 dose of MMR if you were born in 1957 or later. You may also need a 2nd dose. For females of childbearing age, rubella immunity should be determined. If there is no evidence of immunity, females who are not pregnant should be vaccinated. If there is no evidence of immunity, females who are pregnant should delay immunization until after pregnancy.  Pneumococcal 13-valent conjugate (PCV13) vaccine.** / Consult your health care provider.  Pneumococcal polysaccharide (PPSV23) vaccine.** / 1 to 2 doses if you smoke cigarettes or if you have certain conditions.  Meningococcal vaccine.** / Consult your health care provider.  Hepatitis A vaccine.** / Consult your health care provider.  Hepatitis B vaccine.** / Consult your health care provider.  Haemophilus influenzae type b (Hib) vaccine.** / Consult your health care provider. Ages 65   years and over  Blood pressure check.** / Every 1 to 2 years.  Lipid and cholesterol check.** / Every 5 years beginning at age 22 years.  Lung cancer screening. / Every year if you are aged 73-80 years and have a 30-pack-year history of smoking and currently smoke or have quit within the past 15 years. Yearly screening is stopped once you have quit smoking for at least 15 years or develop a health problem that would prevent you from having lung cancer  treatment.  Clinical breast exam.** / Every year after age 4 years.  BRCA-related cancer risk assessment.** / For women who have family members with a BRCA-related cancer (breast, ovarian, tubal, or peritoneal cancers).  Mammogram.** / Every year beginning at age 40 years and continuing for as long as you are in good health. Consult with your health care provider.  Pap test.** / Every 3 years starting at age 9 years through age 34 or 91 years with 3 consecutive normal Pap tests. Testing can be stopped between 65 and 70 years with 3 consecutive normal Pap tests and no abnormal Pap or HPV tests in the past 10 years.  HPV screening.** / Every 3 years from ages 57 years through ages 64 or 45 years with a history of 3 consecutive normal Pap tests. Testing can be stopped between 65 and 70 years with 3 consecutive normal Pap tests and no abnormal Pap or HPV tests in the past 10 years.  Fecal occult blood test (FOBT) of stool. / Every year beginning at age 15 years and continuing until age 17 years. You may not need to do this test if you get a colonoscopy every 10 years.  Flexible sigmoidoscopy or colonoscopy.** / Every 5 years for a flexible sigmoidoscopy or every 10 years for a colonoscopy beginning at age 86 years and continuing until age 71 years.  Hepatitis C blood test.** / For all people born from [birth year] through 1966 and any individual with known risks for hepatitis C.  Osteoporosis screening.** / A one-time screening for women ages 83 years and over and women at risk for fractures or osteoporosis.  Skin self-exam. / Monthly.  Influenza vaccine. / Every year.  Tetanus, diphtheria, and acellular pertussis (Tdap/Td) vaccine.** / 1 dose of Td every 10 years.  Varicella vaccine.** / Consult your health care provider.  Zoster vaccine.** / 1 dose for adults aged 61 years or older.  Pneumococcal 13-valent conjugate (PCV13) vaccine.** / Consult your health care provider.  Pneumococcal  polysaccharide (PPSV23) vaccine.** / 1 dose for all adults aged 28 years and older.  Meningococcal vaccine.** / Consult your health care provider.  Hepatitis A vaccine.** / Consult your health care provider.  Hepatitis B vaccine.** / Consult your health care provider.  Haemophilus influenzae type b (Hib) vaccine.** / Consult your health care provider. ** Family history and personal history of risk and conditions may change your health care provider's recommendations. Document Released: 01/02/2002 Document Revised: 03/23/2014 Document Reviewed: 04/03/2011 [facility] Patient Information 2015 [place], [place]. This information is not intended to replace advice given to you by your health care provider. Make sure you discuss any questions you have with your health care provider.

## 2015-03-16 LAB — HEMOGLOBIN A1C: HEMOGLOBIN A1C: 5.2 % (ref 4.6–6.5)

## 2016-03-16 ENCOUNTER — Encounter: Payer: 59 | Admitting: Family Medicine

## 2016-05-18 ENCOUNTER — Telehealth: Payer: Self-pay | Admitting: Family Medicine

## 2016-05-18 ENCOUNTER — Other Ambulatory Visit: Payer: Self-pay | Admitting: Family Medicine

## 2016-05-18 DIAGNOSIS — Z0000 Encounter for general adult medical examination without abnormal findings: Secondary | ICD-10-CM

## 2016-05-18 NOTE — Telephone Encounter (Signed)
Please advise      KP 

## 2016-05-18 NOTE — Telephone Encounter (Signed)
Orders in 

## 2016-05-18 NOTE — Telephone Encounter (Signed)
Message left advising labs have been ordered.    KP

## 2016-05-18 NOTE — Telephone Encounter (Signed)
Relation to ZO:XWRUpt:self Call back [phone] Pharmacy:  Reason for call:  Mother would like patient physical labs drawn at [facility] for tomorrow morning. Patient has a scheduled CPE for 05/19/16 at  3:30pm.

## 2016-05-19 ENCOUNTER — Ambulatory Visit (INDEPENDENT_AMBULATORY_CARE_PROVIDER_SITE_OTHER): Payer: BLUE CROSS/BLUE SHIELD | Admitting: Family Medicine

## 2016-05-19 ENCOUNTER — Other Ambulatory Visit (INDEPENDENT_AMBULATORY_CARE_PROVIDER_SITE_OTHER): Payer: BLUE CROSS/BLUE SHIELD

## 2016-05-19 ENCOUNTER — Encounter: Payer: Self-pay | Admitting: Family Medicine

## 2016-05-19 VITALS — BP 116/76 | HR 93 | Temp 99.7°F | Ht 65.5 in | Wt 129.4 lb

## 2016-05-19 DIAGNOSIS — Z0000 Encounter for general adult medical examination without abnormal findings: Secondary | ICD-10-CM

## 2016-05-19 LAB — LIPID PANEL
CHOLESTEROL: 168 mg/dL (ref 0–200)
HDL: 53.9 mg/dL (ref >39.00)
LDL Cholesterol: 103 mg/dL — ABNORMAL HIGH (ref 0–99)
NonHDL: 113.91
TRIGLYCERIDES: 57 mg/dL (ref 0.0–149.0)
Total CHOL/HDL Ratio: 3
VLDL: 11.4 mg/dL (ref 0.0–40.0)

## 2016-05-19 LAB — CBC WITH DIFFERENTIAL/PLATELET
BASOS ABS: 0 10*3/uL (ref 0.0–0.1)
BASOS PCT: 0.3 % (ref 0.0–3.0)
Eosinophils Absolute: 0.1 10*3/uL (ref 0.0–0.7)
Eosinophils Relative: 2 % (ref 0.0–5.0)
HEMATOCRIT: 42.8 % (ref 36.0–46.0)
HEMOGLOBIN: 14.7 g/dL (ref 12.0–15.0)
LYMPHS PCT: 37.4 % (ref 12.0–46.0)
Lymphs Abs: 2.1 10*3/uL (ref 0.7–4.0)
MCHC: 34.3 g/dL (ref 30.0–36.0)
MCV: 90.9 fl (ref 78.0–100.0)
MONO ABS: 0.4 10*3/uL (ref 0.1–1.0)
Monocytes Relative: 7.2 % (ref 3.0–12.0)
Neutro Abs: 2.9 10*3/uL (ref 1.4–7.7)
Neutrophils Relative %: 53.1 % (ref 43.0–77.0)
Platelets: 316 10*3/uL (ref 150.0–400.0)
RBC: 4.71 Mil/uL (ref 3.87–5.11)
RDW: 13.4 % (ref 11.5–15.5)
WBC: 5.6 10*3/uL (ref 4.0–10.5)

## 2016-05-19 LAB — COMPREHENSIVE METABOLIC PANEL
ALK PHOS: 36 U/L — AB (ref 39–117)
ALT: 14 U/L (ref 0–35)
AST: 13 U/L (ref 0–37)
Albumin: 4.6 g/dL (ref 3.5–5.2)
BUN: 8 mg/dL (ref 6–23)
CHLORIDE: 104 meq/L (ref 96–112)
CO2: 29 mEq/L (ref 19–32)
Calcium: 9.5 mg/dL (ref 8.4–10.5)
Creatinine, Ser: 0.73 mg/dL (ref 0.40–1.20)
GFR: 106.63 mL/min (ref >60.00)
GLUCOSE: 95 mg/dL (ref 70–99)
Potassium: 4.2 mEq/L (ref 3.5–5.1)
SODIUM: 137 meq/L (ref 135–145)
TOTAL PROTEIN: 7.3 g/dL (ref 6.0–8.3)
Total Bilirubin: 0.7 mg/dL (ref 0.2–1.2)

## 2016-05-19 LAB — TSH: TSH: 4.07 u[IU]/mL (ref 0.35–4.50)

## 2016-05-19 NOTE — Progress Notes (Signed)
Subjective:     Haley Combs is a 20 y.o. female and is here for a comprehensive physical exam. The patient reports no problems.  Social History   Social History  . Marital Status: Single    Spouse Name: N/A  . Number of Children: 0  . Years of Education: N/A   Occupational History  . Student      [employer]   Social History Main Topics  . Smoking status: Never Smoker   . Smokeless tobacco: Never Used  . Alcohol Use: No  . Drug Use: No  . Sexual Activity:    Partners: Male    Birth Control/ Protection: Condom, Pill   Other Topics Concern  . Not on file   Social History Narrative   Exercise-- y 3days a week   Rare caffeine    Health Maintenance  Topic Date Due  . TETANUS/TDAP  03/10/2017 (Originally 01/20/2014)  . PAP SMEAR  05/19/2017 (Originally 01/21/2016)  . INFLUENZA VACCINE  06/20/2016  . HIV Screening  Completed    The following portions of the patient's history were reviewed and updated as appropriate:  She  has a past medical history of Acne; Asthma; GERD (gastroesophageal reflux disease); and Scoliosis. She  does not have any pertinent problems on file. She  has past surgical history that includes Tooth Extraction and Tympanostomy tube placement. Her family history includes Alcohol abuse in her paternal uncle and sister; Dementia in her maternal grandmother; Drug abuse in her sister; Heart disease in her paternal grandfather; Hyperlipidemia in her father; Hypertension in her father, paternal grandfather, and paternal grandmother; Mental illness in her paternal uncle; Stroke in her paternal grandfather. There is no history of Colon cancer or Colon polyps. She  reports that she has never smoked. She has never used smokeless tobacco. She reports that she does not drink alcohol or use illicit drugs. She has a current medication list which includes the following prescription(s): albuterol, beclomethasone, and levora 0.15/30 (28). Current Outpatient Prescriptions  on File Prior to Visit  Medication Sig Dispense Refill  . albuterol (PROAIR HFA) 108 (90 BASE) MCG/ACT inhaler Inhale 2 puffs into the lungs every 6 (six) hours as needed for wheezing or shortness of breath. (Patient not taking: Reported on 05/19/2016) 1 Inhaler 5  . beclomethasone (QVAR) 40 MCG/ACT inhaler Inhale 2 puffs into the lungs 2 (two) times daily. (Patient not taking: Reported on 03/15/2015) 1 Inhaler 12  . LEVORA 0.15/30, 28, 0.15-30 MG-MCG tablet Take 1 tablet by mouth daily. Reported on 05/19/2016     No current facility-administered medications on file prior to visit.   She has No Known Allergies..  Review of Systems Review of Systems  Constitutional: Negative for activity change, appetite change and fatigue.  HENT: Negative for hearing loss, congestion, tinnitus and ear discharge.  dentist [id number] Eyes: Negative for visual disturbance (see optho q1y -- vision corrected to 20/20 with glasses).  Respiratory: Negative for cough, chest tightness and shortness of breath.   Cardiovascular: Negative for chest pain, palpitations and leg swelling.  Gastrointestinal: Negative for abdominal pain, diarrhea, constipation and abdominal distention.  Genitourinary: Negative for urgency, frequency, decreased urine volume and difficulty urinating.  Musculoskeletal: Negative for back pain, arthralgias and gait problem.  Skin: Negative for color change, pallor and rash.  Neurological: Negative for dizziness, light-headedness, numbness and headaches.  Hematological: Negative for adenopathy. Does not bruise/bleed easily.  Psychiatric/Behavioral: Negative for suicidal ideas, confusion, sleep disturbance, self-injury, dysphoric mood, decreased concentration and agitation.  Objective:    BP 116/76 mmHg  Pulse 93  Temp(Src) 99.7 F (37.6 C) (Oral)  Ht 5' 5.5" (1.664 m)  Wt 129 lb 6.4 oz (58.695 kg)  BMI 21.20 kg/m2  SpO2 99%  LMP 04/28/2016 General appearance: alert, cooperative, appears  stated age and no distress Head: Normocephalic, without obvious abnormality, atraumatic Eyes: conjunctivae/corneas clear. PERRL, EOM's intact. Fundi benign. Ears: normal TM's and external ear canals both ears Nose: Nares normal. Septum midline. Mucosa normal. No drainage or sinus tenderness. Throat: lips, mucosa, and tongue normal; teeth and gums normal Neck: no adenopathy, no carotid bruit, no JVD, supple, symmetrical, trachea midline and thyroid not enlarged, symmetric, no tenderness/mass/nodules Back: symmetric, no curvature. ROM normal. No CVA tenderness. Lungs: clear to auscultation bilaterally Breasts: gyn Heart: regular rate and rhythm, S1, S2 normal, no murmur, click, rub or gallop Abdomen: soft, non-tender; bowel sounds normal; no masses,  no organomegaly Pelvic: deferred --gyn Extremities: extremities normal, atraumatic, no cyanosis or edema Pulses: 2+ and symmetric Skin: Skin color, texture, turgor normal. No rashes or lesions Lymph nodes: Cervical, supraclavicular, and axillary nodes normal. Neurologic: Alert and oriented X 3, normal strength and tone. Normal symmetric reflexes. Normal coordination and gait    Assessment:    Healthy female exam.     Plan:     ghm utd See After Visit Summary for Counseling Recommendations   Labs reviewed

## 2016-05-19 NOTE — Patient Instructions (Signed)
Preventive Care for Adults, Female A healthy lifestyle and preventive care can promote health and wellness. Preventive health guidelines for women include the following key practices.  A routine yearly physical is a good way to check with your health care provider about your health and preventive screening. It is a chance to share any concerns and updates on your health and to receive a thorough exam.  Visit your dentist for a routine exam and preventive care every 6 months. Brush your teeth twice a day and floss once a day. Good oral hygiene prevents tooth decay and gum disease.  The frequency of eye exams is based on your age, health, family medical history, use of contact lenses, and other factors. Follow your health care provider's recommendations for frequency of eye exams.  Eat a healthy diet. Foods like vegetables, fruits, whole grains, low-fat dairy products, and lean protein foods contain the nutrients you need without too many calories. Decrease your intake of foods high in solid fats, added sugars, and salt. Eat the right amount of calories for you.Get information about a proper diet from your health care provider, if necessary.  Regular physical exercise is one of the most important things you can do for your health. Most adults should get at least 150 minutes of moderate-intensity exercise (any activity that increases your heart rate and causes you to sweat) each week. In addition, most adults need muscle-strengthening exercises on 2 or more days a week.  Maintain a healthy weight. The body mass index (BMI) is a screening tool to identify possible weight problems. It provides an estimate of body fat based on height and weight. Your health care provider can find your BMI and can help you achieve or maintain a healthy weight.For adults 20 years and older:  A BMI below 18.5 is considered underweight.  A BMI of 18.5 to 24.9 is normal.  A BMI of 25 to 29.9 is considered overweight.  A  BMI of 30 and above is considered obese.  Maintain normal blood lipids and cholesterol levels by exercising and minimizing your intake of saturated fat. Eat a balanced diet with plenty of fruit and vegetables. Blood tests for lipids and cholesterol should begin at age 45 and be repeated every 5 years. If your lipid or cholesterol levels are high, you are over 50, or you are at high risk for heart disease, you may need your cholesterol levels checked more frequently.Ongoing high lipid and cholesterol levels should be treated with medicines if diet and exercise are not working.  If you smoke, find out from your health care provider how to quit. If you do not use tobacco, do not start.  Lung cancer screening is recommended for adults aged 45-80 years who are at high risk for developing lung cancer because of a history of smoking. A yearly low-dose CT scan of the lungs is recommended for people who have at least a 30-pack-year history of smoking and are a current smoker or have quit within the past 15 years. A pack year of smoking is smoking an average of 1 pack of cigarettes a day for 1 year (for example: 1 pack a day for 30 years or 2 packs a day for 15 years). Yearly screening should continue until the smoker has stopped smoking for at least 15 years. Yearly screening should be stopped for people who develop a health problem that would prevent them from having lung cancer treatment.  If you are pregnant, do not drink alcohol. If you are  breastfeeding, be very cautious about drinking alcohol. If you are not pregnant and choose to drink alcohol, do not have more than 1 drink per day. One drink is considered to be 12 ounces (355 mL) of beer, 5 ounces (148 mL) of wine, or 1.5 ounces (44 mL) of liquor.  Avoid use of street drugs. Do not share needles with anyone. Ask for help if you need support or instructions about stopping the use of drugs.  High blood pressure causes heart disease and increases the risk  of stroke. Your blood pressure should be checked at least every 1 to 2 years. Ongoing high blood pressure should be treated with medicines if weight loss and exercise do not work.  If you are 55-79 years old, ask your health care provider if you should take aspirin to prevent strokes.  Diabetes screening is done by taking a blood sample to check your blood glucose level after you have not eaten for a certain period of time (fasting). If you are not overweight and you do not have risk factors for diabetes, you should be screened once every 3 years starting at age 45. If you are overweight or obese and you are 40-70 years of age, you should be screened for diabetes every year as part of your cardiovascular risk assessment.  Breast cancer screening is essential preventive care for women. You should practice "breast self-awareness." This means understanding the normal appearance and feel of your breasts and may include breast self-examination. Any changes detected, no matter how small, should be reported to a health care provider. Women in their 20s and 30s should have a clinical breast exam (CBE) by a health care provider as part of a regular health exam every 1 to 3 years. After age 40, women should have a CBE every year. Starting at age 40, women should consider having a mammogram (breast X-ray test) every year. Women who have a family history of breast cancer should talk to their health care provider about genetic screening. Women at a high risk of breast cancer should talk to their health care providers about having an MRI and a mammogram every year.  Breast cancer gene (BRCA)-related cancer risk assessment is recommended for women who have family members with BRCA-related cancers. BRCA-related cancers include breast, ovarian, tubal, and peritoneal cancers. Having family members with these cancers may be associated with an increased risk for harmful changes (mutations) in the breast cancer genes BRCA1 and  BRCA2. Results of the assessment will determine the need for genetic counseling and BRCA1 and BRCA2 testing.  Your health care provider may recommend that you be screened regularly for cancer of the pelvic organs (ovaries, uterus, and vagina). This screening involves a pelvic examination, including checking for microscopic changes to the surface of your cervix (Pap test). You may be encouraged to have this screening done every 3 years, beginning at age 21.  For women ages 30-65, health care providers may recommend pelvic exams and Pap testing every 3 years, or they may recommend the Pap and pelvic exam, combined with testing for human papilloma virus (HPV), every 5 years. Some types of HPV increase your risk of cervical cancer. Testing for HPV may also be done on women of any age with unclear Pap test results.  Other health care providers may not recommend any screening for nonpregnant women who are considered low risk for pelvic cancer and who do not have symptoms. Ask your health care provider if a screening pelvic exam is right for   you.  If you have had past treatment for cervical cancer or a condition that could lead to cancer, you need Pap tests and screening for cancer for at least 20 years after your treatment. If Pap tests have been discontinued, your risk factors (such as having a new sexual partner) need to be reassessed to determine if screening should resume. Some women have medical problems that increase the chance of getting cervical cancer. In these cases, your health care provider may recommend more frequent screening and Pap tests.  Colorectal cancer can be detected and often prevented. Most routine colorectal cancer screening begins at the age of 50 years and continues through age 75 years. However, your health care provider may recommend screening at an earlier age if you have risk factors for colon cancer. On a yearly basis, your health care provider may provide home test kits to check  for hidden blood in the stool. Use of a small camera at the end of a tube, to directly examine the colon (sigmoidoscopy or colonoscopy), can detect the earliest forms of colorectal cancer. Talk to your health care provider about this at age 50, when routine screening begins. Direct exam of the colon should be repeated every 5-10 years through age 75 years, unless early forms of precancerous polyps or small growths are found.  People who are at an increased risk for hepatitis B should be screened for this virus. You are considered at high risk for hepatitis B if:  You were born in a country where hepatitis B occurs often. Talk with your health care provider about which countries are considered high risk.  Your parents were born in a high-risk country and you have not received a shot to protect against hepatitis B (hepatitis B vaccine).  You have HIV or AIDS.  You use needles to inject street drugs.  You live with, or have sex with, someone who has hepatitis B.  You get hemodialysis treatment.  You take certain medicines for conditions like cancer, organ transplantation, and autoimmune conditions.  Hepatitis C blood testing is recommended for all people born from 1945 through 1965 and any individual with known risks for hepatitis C.  Practice safe sex. Use condoms and avoid high-risk sexual practices to reduce the spread of sexually transmitted infections (STIs). STIs include gonorrhea, chlamydia, syphilis, trichomonas, herpes, HPV, and human immunodeficiency virus (HIV). Herpes, HIV, and HPV are viral illnesses that have no cure. They can result in disability, cancer, and death.  You should be screened for sexually transmitted illnesses (STIs) including gonorrhea and chlamydia if:  You are sexually active and are younger than 24 years.  You are older than 24 years and your health care provider tells you that you are at risk for this type of infection.  Your sexual activity has changed  since you were last screened and you are at an increased risk for chlamydia or gonorrhea. Ask your health care provider if you are at risk.  If you are at risk of being infected with HIV, it is recommended that you take a prescription medicine daily to prevent HIV infection. This is called preexposure prophylaxis (PrEP). You are considered at risk if:  You are sexually active and do not regularly use condoms or know the HIV status of your partner(s).  You take drugs by injection.  You are sexually active with a partner who has HIV.  Talk with your health care provider about whether you are at high risk of being infected with HIV. If   you choose to begin PrEP, you should first be tested for HIV. You should then be tested every 3 months for as long as you are taking PrEP.  Osteoporosis is a disease in which the bones lose minerals and strength with aging. This can result in serious bone fractures or breaks. The risk of osteoporosis can be identified using a bone density scan. Women ages 67 years and over and women at risk for fractures or osteoporosis should discuss screening with their health care providers. Ask your health care provider whether you should take a calcium supplement or vitamin D to reduce the rate of osteoporosis.  Menopause can be associated with physical symptoms and risks. Hormone replacement therapy is available to decrease symptoms and risks. You should talk to your health care provider about whether hormone replacement therapy is right for you.  Use sunscreen. Apply sunscreen liberally and repeatedly throughout the day. You should seek shade when your shadow is shorter than you. Protect yourself by wearing long sleeves, pants, a wide-brimmed hat, and sunglasses year round, whenever you are outdoors.  Once a month, do a whole body skin exam, using a mirror to look at the skin on your back. Tell your health care provider of new moles, moles that have irregular borders, moles that  are larger than a pencil eraser, or moles that have changed in shape or color.  Stay current with required vaccines (immunizations).  Influenza vaccine. All adults should be immunized every year.  Tetanus, diphtheria, and acellular pertussis (Td, Tdap) vaccine. Pregnant women should receive 1 dose of Tdap vaccine during each pregnancy. The dose should be obtained regardless of the length of time since the last dose. Immunization is preferred during the 27th-36th week of gestation. An adult who has not previously received Tdap or who does not know her vaccine status should receive 1 dose of Tdap. This initial dose should be followed by tetanus and diphtheria toxoids (Td) booster doses every 10 years. Adults with an unknown or incomplete history of completing a 3-dose immunization series with Td-containing vaccines should begin or complete a primary immunization series including a Tdap dose. Adults should receive a Td booster every 10 years.  Varicella vaccine. An adult without evidence of immunity to varicella should receive 2 doses or a second dose if she has previously received 1 dose. Pregnant females who do not have evidence of immunity should receive the first dose after pregnancy. This first dose should be obtained before leaving the health care facility. The second dose should be obtained 4-8 weeks after the first dose.  Human papillomavirus (HPV) vaccine. Females aged 13-26 years who have not received the vaccine previously should obtain the 3-dose series. The vaccine is not recommended for use in pregnant females. However, pregnancy testing is not needed before receiving a dose. If a female is found to be pregnant after receiving a dose, no treatment is needed. In that case, the remaining doses should be delayed until after the pregnancy. Immunization is recommended for any person with an immunocompromised condition through the age of 61 years if she did not get any or all doses earlier. During the  3-dose series, the second dose should be obtained 4-8 weeks after the first dose. The third dose should be obtained 24 weeks after the first dose and 16 weeks after the second dose.  Zoster vaccine. One dose is recommended for adults aged 30 years or older unless certain conditions are present.  Measles, mumps, and rubella (MMR) vaccine. Adults born  before 1957 generally are considered immune to measles and mumps. Adults born in 1957 or later should have 1 or more doses of MMR vaccine unless there is a contraindication to the vaccine or there is laboratory evidence of immunity to each of the three diseases. A routine second dose of MMR vaccine should be obtained at least 28 days after the first dose for students attending postsecondary schools, health care workers, or international travelers. People who received inactivated measles vaccine or an unknown type of measles vaccine during 1963-1967 should receive 2 doses of MMR vaccine. People who received inactivated mumps vaccine or an unknown type of mumps vaccine before 1979 and are at high risk for mumps infection should consider immunization with 2 doses of MMR vaccine. For females of childbearing age, rubella immunity should be determined. If there is no evidence of immunity, females who are not pregnant should be vaccinated. If there is no evidence of immunity, females who are pregnant should delay immunization until after pregnancy. Unvaccinated health care workers born before 1957 who lack laboratory evidence of measles, mumps, or rubella immunity or laboratory confirmation of disease should consider measles and mumps immunization with 2 doses of MMR vaccine or rubella immunization with 1 dose of MMR vaccine.  Pneumococcal 13-valent conjugate (PCV13) vaccine. When indicated, a person who is uncertain of his immunization history and has no record of immunization should receive the PCV13 vaccine. All adults 65 years of age and older should receive this  vaccine. An adult aged 19 years or older who has certain medical conditions and has not been previously immunized should receive 1 dose of PCV13 vaccine. This PCV13 should be followed with a dose of pneumococcal polysaccharide (PPSV23) vaccine. Adults who are at high risk for pneumococcal disease should obtain the PPSV23 vaccine at least 8 weeks after the dose of PCV13 vaccine. Adults older than 21 years of age who have normal immune system function should obtain the PPSV23 vaccine dose at least 1 year after the dose of PCV13 vaccine.  Pneumococcal polysaccharide (PPSV23) vaccine. When PCV13 is also indicated, PCV13 should be obtained first. All adults aged 65 years and older should be immunized. An adult younger than age 65 years who has certain medical conditions should be immunized. Any person who resides in a nursing home or long-term care facility should be immunized. An adult smoker should be immunized. People with an immunocompromised condition and certain other conditions should receive both PCV13 and PPSV23 vaccines. People with human immunodeficiency virus (HIV) infection should be immunized as soon as possible after diagnosis. Immunization during chemotherapy or radiation therapy should be avoided. Routine use of PPSV23 vaccine is not recommended for American Indians, [place] Natives, or people younger than 65 years unless there are medical conditions that require PPSV23 vaccine. When indicated, people who have unknown immunization and have no record of immunization should receive PPSV23 vaccine. One-time revaccination 5 years after the first dose of PPSV23 is recommended for people aged 19-64 years who have chronic kidney failure, nephrotic syndrome, asplenia, or immunocompromised conditions. People who received 1-2 doses of PPSV23 before age 65 years should receive another dose of PPSV23 vaccine at age 65 years or later if at least 5 years have passed since the previous dose. Doses of PPSV23 are not  needed for people immunized with PPSV23 at or after age 65 years.  Meningococcal vaccine. Adults with asplenia or persistent complement component deficiencies should receive 2 doses of quadrivalent meningococcal conjugate (MenACWY-D) vaccine. The doses should be obtained   at least 2 months apart. Microbiologists working with certain meningococcal bacteria, [place] recruits, people at risk during an outbreak, and people who travel to or live in countries with a high rate of meningitis should be immunized. A first-year college student up through age 34 years who is living in a residence hall should receive a dose if she did not receive a dose on or after her 16th birthday. Adults who have certain high-risk conditions should receive one or more doses of vaccine.  Hepatitis A vaccine. Adults who wish to be protected from this disease, have certain high-risk conditions, work with hepatitis A-infected animals, work in hepatitis A research labs, or travel to or work in countries with a high rate of hepatitis A should be immunized. Adults who were previously unvaccinated and who anticipate close contact with an international adoptee during the first 60 days after arrival in the [place] from a country with a high rate of hepatitis A should be immunized.  Hepatitis B vaccine. Adults who wish to be protected from this disease, have certain high-risk conditions, may be exposed to blood or other infectious body fluids, are household contacts or sex partners of hepatitis B positive people, are clients or workers in certain care facilities, or travel to or work in countries with a high rate of hepatitis B should be immunized.  Haemophilus influenzae type b (Hib) vaccine. A previously unvaccinated person with asplenia or sickle cell disease or having a scheduled splenectomy should receive 1 dose of Hib vaccine. Regardless of previous immunization, a recipient of a hematopoietic stem cell transplant should receive a  3-dose series 6-12 months after her successful transplant. Hib vaccine is not recommended for adults with HIV infection. Preventive Services / Frequency Ages 35 to 4 years  Blood pressure check.** / Every 3-5 years.  Lipid and cholesterol check.** / Every 5 years beginning at age 60.  Clinical breast exam.** / Every 3 years for women in their 71s and 10s.  BRCA-related cancer risk assessment.** / For women who have family members with a BRCA-related cancer (breast, ovarian, tubal, or peritoneal cancers).  Pap test.** / Every 2 years from ages 76 through 26. Every 3 years starting at age 61 through age 76 or 93 with a history of 3 consecutive normal Pap tests.  HPV screening.** / Every 3 years from ages 37 through ages 60 to 51 with a history of 3 consecutive normal Pap tests.  Hepatitis C blood test.** / For any individual with known risks for hepatitis C.  Skin self-exam. / Monthly.  Influenza vaccine. / Every year.  Tetanus, diphtheria, and acellular pertussis (Tdap, Td) vaccine.** / Consult your health care provider. Pregnant women should receive 1 dose of Tdap vaccine during each pregnancy. 1 dose of Td every 10 years.  Varicella vaccine.** / Consult your health care provider. Pregnant females who do not have evidence of immunity should receive the first dose after pregnancy.  HPV vaccine. / 3 doses over 6 months, if 93 and younger. The vaccine is not recommended for use in pregnant females. However, pregnancy testing is not needed before receiving a dose.  Measles, mumps, rubella (MMR) vaccine.** / You need at least 1 dose of MMR if you were born in 1957 or later. You may also need a 2nd dose. For females of childbearing age, rubella immunity should be determined. If there is no evidence of immunity, females who are not pregnant should be vaccinated. If there is no evidence of immunity, females who are  pregnant should delay immunization until after pregnancy.  Pneumococcal  13-valent conjugate (PCV13) vaccine.** / Consult your health care provider.  Pneumococcal polysaccharide (PPSV23) vaccine.** / 1 to 2 doses if you smoke cigarettes or if you have certain conditions.  Meningococcal vaccine.** / 1 dose if you are age 68 to 8 years and a Market researcher living in a residence hall, or have one of several medical conditions, you need to get vaccinated against meningococcal disease. You may also need additional booster doses.  Hepatitis A vaccine.** / Consult your health care provider.  Hepatitis B vaccine.** / Consult your health care provider.  Haemophilus influenzae type b (Hib) vaccine.** / Consult your health care provider. Ages 7 to 53 years  Blood pressure check.** / Every year.  Lipid and cholesterol check.** / Every 5 years beginning at age 25 years.  Lung cancer screening. / Every year if you are aged 11-80 years and have a 30-pack-year history of smoking and currently smoke or have quit within the past 15 years. Yearly screening is stopped once you have quit smoking for at least 15 years or develop a health problem that would prevent you from having lung cancer treatment.  Clinical breast exam.** / Every year after age 48 years.  BRCA-related cancer risk assessment.** / For women who have family members with a BRCA-related cancer (breast, ovarian, tubal, or peritoneal cancers).  Mammogram.** / Every year beginning at age 41 years and continuing for as long as you are in good health. Consult with your health care provider.  Pap test.** / Every 3 years starting at age 65 years through age 37 or 70 years with a history of 3 consecutive normal Pap tests.  HPV screening.** / Every 3 years from ages 72 years through ages 60 to 40 years with a history of 3 consecutive normal Pap tests.  Fecal occult blood test (FOBT) of stool. / Every year beginning at age 21 years and continuing until age 5 years. You may not need to do this test if you get  a colonoscopy every 10 years.  Flexible sigmoidoscopy or colonoscopy.** / Every 5 years for a flexible sigmoidoscopy or every 10 years for a colonoscopy beginning at age 35 years and continuing until age 48 years.  Hepatitis C blood test.** / For all people born from [birth year] through 1965 and any individual with known risks for hepatitis C.  Skin self-exam. / Monthly.  Influenza vaccine. / Every year.  Tetanus, diphtheria, and acellular pertussis (Tdap/Td) vaccine.** / Consult your health care provider. Pregnant women should receive 1 dose of Tdap vaccine during each pregnancy. 1 dose of Td every 10 years.  Varicella vaccine.** / Consult your health care provider. Pregnant females who do not have evidence of immunity should receive the first dose after pregnancy.  Zoster vaccine.** / 1 dose for adults aged 30 years or older.  Measles, mumps, rubella (MMR) vaccine.** / You need at least 1 dose of MMR if you were born in 1957 or later. You may also need a second dose. For females of childbearing age, rubella immunity should be determined. If there is no evidence of immunity, females who are not pregnant should be vaccinated. If there is no evidence of immunity, females who are pregnant should delay immunization until after pregnancy.  Pneumococcal 13-valent conjugate (PCV13) vaccine.** / Consult your health care provider.  Pneumococcal polysaccharide (PPSV23) vaccine.** / 1 to 2 doses if you smoke cigarettes or if you have certain conditions.  Meningococcal vaccine.** /  Consult your health care provider.  Hepatitis A vaccine.** / Consult your health care provider.  Hepatitis B vaccine.** / Consult your health care provider.  Haemophilus influenzae type b (Hib) vaccine.** / Consult your health care provider. Ages 64 years and over  Blood pressure check.** / Every year.  Lipid and cholesterol check.** / Every 5 years beginning at age 23 years.  Lung cancer screening. / Every year if you  are aged 16-80 years and have a 30-pack-year history of smoking and currently smoke or have quit within the past 15 years. Yearly screening is stopped once you have quit smoking for at least 15 years or develop a health problem that would prevent you from having lung cancer treatment.  Clinical breast exam.** / Every year after age 74 years.  BRCA-related cancer risk assessment.** / For women who have family members with a BRCA-related cancer (breast, ovarian, tubal, or peritoneal cancers).  Mammogram.** / Every year beginning at age 44 years and continuing for as long as you are in good health. Consult with your health care provider.  Pap test.** / Every 3 years starting at age 58 years through age 22 or 39 years with 3 consecutive normal Pap tests. Testing can be stopped between 65 and 70 years with 3 consecutive normal Pap tests and no abnormal Pap or HPV tests in the past 10 years.  HPV screening.** / Every 3 years from ages 64 years through ages 70 or 61 years with a history of 3 consecutive normal Pap tests. Testing can be stopped between 65 and 70 years with 3 consecutive normal Pap tests and no abnormal Pap or HPV tests in the past 10 years.  Fecal occult blood test (FOBT) of stool. / Every year beginning at age 40 years and continuing until age 27 years. You may not need to do this test if you get a colonoscopy every 10 years.  Flexible sigmoidoscopy or colonoscopy.** / Every 5 years for a flexible sigmoidoscopy or every 10 years for a colonoscopy beginning at age 7 years and continuing until age 32 years.  Hepatitis C blood test.** / For all people born from [birth year] through 1965 and any individual with known risks for hepatitis C.  Osteoporosis screening.** / A one-time screening for women ages 30 years and over and women at risk for fractures or osteoporosis.  Skin self-exam. / Monthly.  Influenza vaccine. / Every year.  Tetanus, diphtheria, and acellular pertussis (Tdap/Td)  vaccine.** / 1 dose of Td every 10 years.  Varicella vaccine.** / Consult your health care provider.  Zoster vaccine.** / 1 dose for adults aged 35 years or older.  Pneumococcal 13-valent conjugate (PCV13) vaccine.** / Consult your health care provider.  Pneumococcal polysaccharide (PPSV23) vaccine.** / 1 dose for all adults aged 46 years and older.  Meningococcal vaccine.** / Consult your health care provider.  Hepatitis A vaccine.** / Consult your health care provider.  Hepatitis B vaccine.** / Consult your health care provider.  Haemophilus influenzae type b (Hib) vaccine.** / Consult your health care provider. ** Family history and personal history of risk and conditions may change your health care provider's recommendations.   This information is not intended to replace advice given to you by your health care provider. Make sure you discuss any questions you have with your health care provider.   Document Released: 01/02/2002 Document Revised: 11/27/2014 Document Reviewed: 04/03/2011 Elsevier Interactive Patient Education [employer].

## 2016-05-19 NOTE — Progress Notes (Signed)
Pre visit review using our clinic review tool, if applicable. No additional management support is needed unless otherwise documented below in the visit note. 

## 2016-06-26 DIAGNOSIS — Z6821 Body mass index (BMI) 21.0-21.9, adult: Secondary | ICD-10-CM | POA: Diagnosis not present

## 2016-06-26 DIAGNOSIS — Z01419 Encounter for gynecological examination (general) (routine) without abnormal findings: Secondary | ICD-10-CM | POA: Diagnosis not present

## 2016-10-04 DIAGNOSIS — L218 Other seborrheic dermatitis: Secondary | ICD-10-CM | POA: Diagnosis not present

## 2016-10-04 DIAGNOSIS — L650 Telogen effluvium: Secondary | ICD-10-CM | POA: Diagnosis not present

## 2017-04-05 DIAGNOSIS — N390 Urinary tract infection, site not specified: Secondary | ICD-10-CM | POA: Diagnosis not present

## 2017-04-05 DIAGNOSIS — N9089 Other specified noninflammatory disorders of vulva and perineum: Secondary | ICD-10-CM | POA: Diagnosis not present

## 2017-05-07 DIAGNOSIS — B373 Candidiasis of vulva and vagina: Secondary | ICD-10-CM | POA: Diagnosis not present

## 2017-05-07 DIAGNOSIS — N760 Acute vaginitis: Secondary | ICD-10-CM | POA: Diagnosis not present

## 2017-06-14 ENCOUNTER — Encounter: Payer: Self-pay | Admitting: Family Medicine

## 2017-06-14 ENCOUNTER — Ambulatory Visit (INDEPENDENT_AMBULATORY_CARE_PROVIDER_SITE_OTHER): Payer: BLUE CROSS/BLUE SHIELD | Admitting: Family Medicine

## 2017-06-14 VITALS — BP 110/60 | HR 93 | Temp 99.0°F | Resp 16 | Ht 65.0 in | Wt 123.4 lb

## 2017-06-14 DIAGNOSIS — Z0000 Encounter for general adult medical examination without abnormal findings: Secondary | ICD-10-CM | POA: Diagnosis not present

## 2017-06-14 LAB — POC URINALSYSI DIPSTICK (AUTOMATED)
Bilirubin, UA: NEGATIVE
Glucose, UA: NEGATIVE
Ketones, UA: NEGATIVE
LEUKOCYTES UA: NEGATIVE
NITRITE UA: NEGATIVE
PH UA: 6 (ref 5.0–8.0)
PROTEIN UA: NEGATIVE
RBC UA: NEGATIVE
Spec Grav, UA: 1.01 (ref 1.010–1.025)
UROBILINOGEN UA: 0.2 U/dL

## 2017-06-14 NOTE — Progress Notes (Signed)
Subjective:   I acted as a Neurosurgeonscribe for Dr. Zola ButtonLowne-Chase.  Haley Combs, CMA   Haley Combs is a 21 y.o. female and is here for a comprehensive physical exam. The patient reports no problems.  Social History   Social History  . Marital status: Single    Spouse name: N/A  . Number of children: 0  . Years of education: N/A   Occupational History  . Not on file.   Social History Main Topics  . Smoking status: Never Smoker  . Smokeless tobacco: Never Used  . Alcohol use No  . Drug use: No  . Sexual activity: Not Currently    Partners: Male    Birth control/ protection: Condom, Pill   Other Topics Concern  . Not on file   Social History Narrative   Exercise-- y 3days a week   Rare caffeine    Health Maintenance  Topic Date Due  . PAP SMEAR  01/21/2016  . INFLUENZA VACCINE  06/20/2017  . TETANUS/TDAP  07/11/2017  . HIV Screening  Completed    The following portions of the patient's history were reviewed and updated as appropriate:  She  has a past medical history of Acne; Asthma; GERD (gastroesophageal reflux disease); and Scoliosis. She  does not have any pertinent problems on file. She  has a past surgical history that includes Tooth Extraction and Tympanostomy tube placement. Her family history includes Alcohol abuse in her paternal uncle and sister; Dementia in her maternal grandmother; Drug abuse in her sister; Heart disease in her paternal grandfather; Hyperlipidemia in her father; Hypertension in her father, paternal grandfather, and paternal grandmother; Mental illness in her paternal uncle; Stroke in her paternal grandfather. She  reports that she has never smoked. She has never used smokeless tobacco. She reports that she does not drink alcohol or use drugs. She has a current medication list which includes the following prescription(s): blisovi 24 fe. No current outpatient prescriptions on file prior to visit.   No current facility-administered medications on file prior to  visit.    She has No Known Allergies..  Review of Systems Review of Systems  Constitutional: Negative for activity change, appetite change and fatigue.  HENT: Negative for hearing loss, congestion, tinnitus and ear discharge.  dentist [id number] Eyes: Negative for visual disturbance (see optho q1y -- vision corrected to 20/20 with glasses).  Respiratory: Negative for cough, chest tightness and shortness of breath.   Cardiovascular: Negative for chest pain, palpitations and leg swelling.  Gastrointestinal: Negative for abdominal pain, diarrhea, constipation and abdominal distention.  Genitourinary: Negative for urgency, frequency, decreased urine volume and difficulty urinating.  Musculoskeletal: Negative for back pain, arthralgias and gait problem.  Skin: Negative for color change, pallor and rash.  Neurological: Negative for dizziness, light-headedness, numbness and headaches.  Hematological: Negative for adenopathy. Does not bruise/bleed easily.  Psychiatric/Behavioral: Negative for suicidal ideas, confusion, sleep disturbance, self-injury, dysphoric mood, decreased concentration and agitation.       Objective:    BP 110/60 (BP Location: Right Arm, Cuff Size: Normal)   Pulse 93   Temp 99 F (37.2 C) (Oral)   Resp 16   Ht 5\' 5"  (1.651 m)   Wt 123 lb 6.4 oz (56 kg)   LMP 05/19/2017   SpO2 98%   BMI 20.53 kg/m  General appearance: alert, cooperative, appears stated age and no distress Head: Normocephalic, without obvious abnormality, atraumatic Eyes: conjunctivae/corneas clear. PERRL, EOM's intact. Fundi benign. Ears: normal TM's and external ear canals both ears  Nose: Nares normal. Septum midline. Mucosa normal. No drainage or sinus tenderness. Throat: lips, mucosa, and tongue normal; teeth and gums normal Neck: no adenopathy, no carotid bruit, no JVD, supple, symmetrical, trachea midline and thyroid not enlarged, symmetric, no tenderness/mass/nodules Back: symmetric, no  curvature. ROM normal. No CVA tenderness. Lungs: clear to auscultation bilaterally Breasts: gyn Heart: regular rate and rhythm, S1, S2 normal, no murmur, click, rub or gallop Abdomen: soft, non-tender; bowel sounds normal; no masses,  no organomegaly Pelvic: deferred --- gyn Extremities: extremities normal, atraumatic, no cyanosis or edema Pulses: 2+ and symmetric Skin: Skin color, texture, turgor normal. No rashes or lesions Lymph nodes: Cervical, supraclavicular, and axillary nodes normal. Neurologic: Alert and oriented X 3, normal strength and tone. Normal symmetric reflexes. Normal coordination and gait    Assessment:    Healthy female exam.      Plan:    ghm utd Check labs  See After Visit Summary for Counseling Recommendations    1. Preventative health care See above - CBC with Differential/Platelet - POCT Urinalysis Dipstick (Automated) - Comprehensive metabolic panel - Lipid panel - TSH

## 2017-06-14 NOTE — Patient Instructions (Signed)
Preventive Care 18-39 Years, Female Preventive care refers to lifestyle choices and visits with your health care provider that can promote health and wellness. What does preventive care include?  A yearly physical exam. This is also called an annual well check.  Dental exams once or twice a year.  Routine eye exams. Ask your health care provider how often you should have your eyes checked.  Personal lifestyle choices, including: ? Daily care of your teeth and gums. ? Regular physical activity. ? Eating a healthy diet. ? Avoiding tobacco and drug use. ? Limiting alcohol use. ? Practicing safe sex. ? Taking vitamin and mineral supplements as recommended by your health care provider. What happens during an annual well check? The services and screenings done by your health care provider during your annual well check will depend on your age, overall health, lifestyle risk factors, and family history of disease. Counseling Your health care provider may ask you questions about your:  Alcohol use.  Tobacco use.  Drug use.  Emotional well-being.  Home and relationship well-being.  Sexual activity.  Eating habits.  Work and work Statistician.  Method of birth control.  Menstrual cycle.  Pregnancy history.  Screening You may have the following tests or measurements:  Height, weight, and BMI.  Diabetes screening. This is done by checking your blood sugar (glucose) after you have not eaten for a while (fasting).  Blood pressure.  Lipid and cholesterol levels. These may be checked every 5 years starting at age 66.  Skin check.  Hepatitis C blood test.  Hepatitis B blood test.  Sexually transmitted disease (STD) testing.  BRCA-related cancer screening. This may be done if you have a family history of breast, ovarian, tubal, or peritoneal cancers.  Pelvic exam and Pap test. This may be done every 3 years starting at age 40. Starting at age 59, this may be done every 5  years if you have a Pap test in combination with an HPV test.  Discuss your test results, treatment options, and if necessary, the need for more tests with your health care provider. Vaccines Your health care provider may recommend certain vaccines, such as:  Influenza vaccine. This is recommended every year.  Tetanus, diphtheria, and acellular pertussis (Tdap, Td) vaccine. You may need a Td booster every 10 years.  Varicella vaccine. You may need this if you have not been vaccinated.  HPV vaccine. If you are 69 or younger, you may need three doses over 6 months.  Measles, mumps, and rubella (MMR) vaccine. You may need at least one dose of MMR. You may also need a second dose.  Pneumococcal 13-valent conjugate (PCV13) vaccine. You may need this if you have certain conditions and were not previously vaccinated.  Pneumococcal polysaccharide (PPSV23) vaccine. You may need one or two doses if you smoke cigarettes or if you have certain conditions.  Meningococcal vaccine. One dose is recommended if you are age 27-21 years and a first-year college student living in a residence hall, or if you have one of several medical conditions. You may also need additional booster doses.  Hepatitis A vaccine. You may need this if you have certain conditions or if you travel or work in places where you may be exposed to hepatitis A.  Hepatitis B vaccine. You may need this if you have certain conditions or if you travel or work in places where you may be exposed to hepatitis B.  Haemophilus influenzae type b (Hib) vaccine. You may need this if  you have certain risk factors.  Talk to your health care provider about which screenings and vaccines you need and how often you need them. This information is not intended to replace advice given to you by your health care provider. Make sure you discuss any questions you have with your health care provider. Document Released: 01/02/2002 Document Revised: 07/26/2016  Document Reviewed: 09/07/2015 Elsevier Interactive Patient Education  2017 [employer].

## 2017-06-15 LAB — COMPREHENSIVE METABOLIC PANEL
ALT: 58 U/L — AB (ref 0–35)
AST: 38 U/L — AB (ref 0–37)
Albumin: 4.3 g/dL (ref 3.5–5.2)
Alkaline Phosphatase: 24 U/L — ABNORMAL LOW (ref 39–117)
BUN: 9 mg/dL (ref 6–23)
CALCIUM: 9.5 mg/dL (ref 8.4–10.5)
CHLORIDE: 106 meq/L (ref 96–112)
CO2: 26 meq/L (ref 19–32)
CREATININE: 0.74 mg/dL (ref 0.40–1.20)
GFR: 103.93 mL/min (ref >60.00)
GLUCOSE: 86 mg/dL (ref 70–99)
Potassium: 4.3 mEq/L (ref 3.5–5.1)
SODIUM: 139 meq/L (ref 135–145)
Total Bilirubin: 0.3 mg/dL (ref 0.2–1.2)
Total Protein: 7.2 g/dL (ref 6.0–8.3)

## 2017-06-15 LAB — LIPID PANEL
CHOL/HDL RATIO: 4
Cholesterol: 208 mg/dL — ABNORMAL HIGH (ref 0–200)
HDL: 56.2 mg/dL (ref >39.00)
LDL CALC: 133 mg/dL — AB (ref 0–99)
NONHDL: 152.24
TRIGLYCERIDES: 98 mg/dL (ref 0.0–149.0)
VLDL: 19.6 mg/dL (ref 0.0–40.0)

## 2017-06-15 LAB — CBC WITH DIFFERENTIAL/PLATELET
Basophils Absolute: 0.1 10*3/uL (ref 0.0–0.1)
Basophils Relative: 0.8 % (ref 0.0–3.0)
EOS PCT: 1 % (ref 0.0–5.0)
Eosinophils Absolute: 0.1 10*3/uL (ref 0.0–0.7)
HCT: 35.5 % — ABNORMAL LOW (ref 36.0–46.0)
HEMOGLOBIN: 11.9 g/dL — AB (ref 12.0–15.0)
LYMPHS PCT: 21.4 % (ref 12.0–46.0)
Lymphs Abs: 1.6 10*3/uL (ref 0.7–4.0)
MCHC: 33.5 g/dL (ref 30.0–36.0)
MCV: 94.7 fl (ref 78.0–100.0)
MONOS PCT: 7.6 % (ref 3.0–12.0)
Monocytes Absolute: 0.6 10*3/uL (ref 0.1–1.0)
NEUTROS PCT: 69.2 % (ref 43.0–77.0)
Neutro Abs: 5.1 10*3/uL (ref 1.4–7.7)
Platelets: 365 10*3/uL (ref 150.0–400.0)
RBC: 3.74 Mil/uL — AB (ref 3.87–5.11)
RDW: 13.5 % (ref 11.5–15.5)
WBC: 7.4 10*3/uL (ref 4.0–10.5)

## 2017-06-15 LAB — TSH: TSH: 2.84 u[IU]/mL (ref 0.35–4.50)

## 2017-06-18 ENCOUNTER — Other Ambulatory Visit: Payer: Self-pay | Admitting: *Deleted

## 2017-06-18 DIAGNOSIS — Z23 Encounter for immunization: Secondary | ICD-10-CM

## 2017-06-18 DIAGNOSIS — R748 Abnormal levels of other serum enzymes: Secondary | ICD-10-CM

## 2017-06-18 DIAGNOSIS — E785 Hyperlipidemia, unspecified: Secondary | ICD-10-CM

## 2017-07-02 ENCOUNTER — Other Ambulatory Visit: Payer: BLUE CROSS/BLUE SHIELD

## 2017-07-10 ENCOUNTER — Other Ambulatory Visit (INDEPENDENT_AMBULATORY_CARE_PROVIDER_SITE_OTHER): Payer: BLUE CROSS/BLUE SHIELD

## 2017-07-10 DIAGNOSIS — E785 Hyperlipidemia, unspecified: Secondary | ICD-10-CM | POA: Diagnosis not present

## 2017-07-10 DIAGNOSIS — R748 Abnormal levels of other serum enzymes: Secondary | ICD-10-CM

## 2017-07-10 LAB — COMPREHENSIVE METABOLIC PANEL
ALK PHOS: 36 U/L — AB (ref 39–117)
ALT: 23 U/L (ref 0–35)
AST: 28 U/L (ref 0–37)
Albumin: 4.3 g/dL (ref 3.5–5.2)
BILIRUBIN TOTAL: 0.5 mg/dL (ref 0.2–1.2)
BUN: 8 mg/dL (ref 6–23)
CALCIUM: 9.8 mg/dL (ref 8.4–10.5)
CO2: 32 mEq/L (ref 19–32)
Chloride: 105 mEq/L (ref 96–112)
Creatinine, Ser: 0.73 mg/dL (ref 0.40–1.20)
GFR: 105.51 mL/min (ref >60.00)
Glucose, Bld: 70 mg/dL (ref 70–99)
POTASSIUM: 3.7 meq/L (ref 3.5–5.1)
Sodium: 140 mEq/L (ref 135–145)
TOTAL PROTEIN: 7.5 g/dL (ref 6.0–8.3)

## 2017-07-10 LAB — LIPID PANEL
CHOLESTEROL: 158 mg/dL (ref 0–200)
HDL: 54.7 mg/dL (ref >39.00)
LDL Cholesterol: 80 mg/dL (ref 0–99)
NonHDL: 103.13
TRIGLYCERIDES: 115 mg/dL (ref 0.0–149.0)
Total CHOL/HDL Ratio: 3
VLDL: 23 mg/dL (ref 0.0–40.0)

## 2017-07-11 LAB — HEPATITIS PANEL, ACUTE
HCV AB: NONREACTIVE
HEP A IGM: NONREACTIVE
HEP B C IGM: NONREACTIVE
HEP B S AG: NONREACTIVE

## 2017-08-06 DIAGNOSIS — N760 Acute vaginitis: Secondary | ICD-10-CM | POA: Diagnosis not present

## 2017-08-06 DIAGNOSIS — Z6820 Body mass index (BMI) 20.0-20.9, adult: Secondary | ICD-10-CM | POA: Diagnosis not present

## 2017-08-06 DIAGNOSIS — Z01419 Encounter for gynecological examination (general) (routine) without abnormal findings: Secondary | ICD-10-CM | POA: Diagnosis not present

## 2018-01-07 ENCOUNTER — Encounter: Payer: Self-pay | Admitting: Family Medicine

## 2018-01-07 ENCOUNTER — Ambulatory Visit: Payer: BLUE CROSS/BLUE SHIELD | Admitting: Family Medicine

## 2018-01-07 ENCOUNTER — Ambulatory Visit: Payer: Self-pay

## 2018-01-07 VITALS — BP 108/80 | HR 94 | Temp 98.3°F | Resp 16 | Ht 65.0 in | Wt 126.0 lb

## 2018-01-07 DIAGNOSIS — R002 Palpitations: Secondary | ICD-10-CM | POA: Diagnosis not present

## 2018-01-07 DIAGNOSIS — H5461 Unqualified visual loss, right eye, normal vision left eye: Secondary | ICD-10-CM | POA: Diagnosis not present

## 2018-01-07 DIAGNOSIS — R51 Headache: Secondary | ICD-10-CM

## 2018-01-07 DIAGNOSIS — H53002 Unspecified amblyopia, left eye: Secondary | ICD-10-CM

## 2018-01-07 DIAGNOSIS — G43909 Migraine, unspecified, not intractable, without status migrainosus: Secondary | ICD-10-CM | POA: Diagnosis not present

## 2018-01-07 DIAGNOSIS — R519 Headache, unspecified: Secondary | ICD-10-CM

## 2018-01-07 DIAGNOSIS — H538 Other visual disturbances: Secondary | ICD-10-CM | POA: Diagnosis not present

## 2018-01-07 NOTE — Telephone Encounter (Signed)
Appt w/ PCP this afternoon.

## 2018-01-07 NOTE — Assessment & Plan Note (Signed)
Sinus tach on ekg ? Anxiety Check labs

## 2018-01-07 NOTE — Telephone Encounter (Signed)
  Reason for Disposition . [1] Eye pain AND [2] brief (now gone) blurred vision or visual changes  Answer Assessment - Initial Assessment Questions 1. DESCRIPTION: "What is the vision loss like? Describe it for me." (e.g., complete vision loss, blurred vision, double vision, floaters, etc.)     Blurry - saw brightness at first and difficulty focus 2. LOCATION: "One or both eyes?" If one, ask: "Which eye?"     Left eye 3. SEVERITY: "Can you see anything?" If so, ask: "What can you see?" (e.g., fine print)     Can see 4. ONSET: "When did this begin?" "Did it start suddenly or has this been gradual?"     Today 5. PATTERN: "Does this come and go, or has it been constant since it started?"     Started this morning 6. PAIN: "Is there any pain in your eye(s)?"  (Scale 1-10; or mild, moderate, severe)     Pain behind left eye 7. CONTACTS-GLASSES: "Do you wear contacts or glasses?"     No 8. CAUSE: "What do you think is causing this visual problem?"     Unsure 9. OTHER SYMPTOMS: "Do you have any other symptoms?" (e.g., confusion, headache, arm or leg weakness, speech problems)     Had some confusion 10. PREGNANCY: "Is there any chance you are pregnant?" "When was your last menstrual period?"       No  Protocols used: VISION LOSS OR CHANGE-A-AH

## 2018-01-07 NOTE — Progress Notes (Signed)
Patient ID: Haley Combs, female   DOB: 06/07/1996, 22 y.o.   MRN: [id number]    Subjective:  I acted as a Education administrator for Dr. Carollee Herter.  Guerry Bruin, [place]   Patient ID: Haley Combs, female    DOB: [birth year], 22 y.o.   MRN: [id number]  Chief Complaint  Patient presents with  . Headache    Headache   This is a new problem. Episode onset: 12pm today. The pain is located in the frontal region. The quality of the pain is described as throbbing and aching. Associated symptoms include abnormal behavior (randomly crying), blurred vision, dizziness and a visual change. Pertinent negatives include no abdominal pain, back pain, coughing, fever, hearing loss, insomnia, nausea, vomiting or weakness. She has tried nothing for the symptoms.    Patient is in today for headache.  She has taken nothing for it.  She c/o transient visiual loss in R eye.  She has a hx of lazy eye in L eye.  She stated it was like the periphery of her vision blacked out   Patient Care Team: Carollee Herter, Alferd Apa, DO as PCP - General (Family Medicine) Marylynn Pearson, MD as Consulting Physician (Obstetrics and Gynecology)   Past Medical History:  Diagnosis Date  . Acne   . Asthma    Weather Induced heat/cold  . GERD (gastroesophageal reflux disease)   . Scoliosis     Past Surgical History:  Procedure Laterality Date  . TOOTH EXTRACTION    . TYMPANOSTOMY TUBE PLACEMENT      Family History  Problem Relation Age of Onset  . Hypertension Father        Not Medicated  . Hyperlipidemia Father   . Alcohol abuse Sister   . Drug abuse Sister   . Alcohol abuse Paternal Uncle   . Mental illness Paternal Uncle        Schizophrania  . Hypertension Paternal Grandmother   . Hypertension Paternal Grandfather   . Stroke Paternal Grandfather   . Heart disease Paternal Grandfather        Pacemaker, Ruptured Aorta  . Dementia Maternal Grandmother   . Colon cancer Neg Hx   . Colon polyps Neg Hx     Social History   Socioeconomic  History  . Marital status: Single    Spouse name: Not on file  . Number of children: 0  . Years of education: Not on file  . Highest education level: Not on file  Social Needs  . Financial resource strain: Not on file  . Food insecurity - worry: Not on file  . Food insecurity - inability: Not on file  . Transportation needs - medical: Not on file  . Transportation needs - non-medical: Not on file  Occupational History  . Not on file  Tobacco Use  . Smoking status: Never Smoker  . Smokeless tobacco: Never Used  Substance and Sexual Activity  . Alcohol use: No  . Drug use: No  . Sexual activity: Not Currently    Partners: Male    Birth control/protection: Condom, Pill  Other Topics Concern  . Not on file  Social History Narrative   Exercise-- y 3days a week   Rare caffeine     Outpatient Medications Prior to Visit  Medication Sig Dispense Refill  . ALTAVERA 0.15-30 MG-MCG tablet Take 1 tablet by mouth daily.  0  . BLISOVI 24 FE 1-20 MG-MCG(24) tablet Take 1 tablet by mouth daily.  1   No facility-administered medications prior to visit.  No Known Allergies  Review of Systems  Constitutional: Negative for chills, fever and malaise/fatigue.  HENT: Negative for congestion and hearing loss.   Eyes: Positive for blurred vision. Negative for discharge.  Respiratory: Positive for shortness of breath. Negative for cough and sputum production.   Cardiovascular: Positive for palpitations. Negative for chest pain and leg swelling.  Gastrointestinal: Negative for abdominal pain, blood in stool, constipation, diarrhea, heartburn, nausea and vomiting.  Genitourinary: Negative for dysuria, frequency, hematuria and urgency.  Musculoskeletal: Negative for back pain, falls and myalgias.  Skin: Negative for rash.  Neurological: Positive for dizziness and headaches. Negative for sensory change, loss of consciousness and weakness.  Endo/Heme/Allergies: Negative for environmental  allergies. Does not bruise/bleed easily.  Psychiatric/Behavioral: Negative for depression and suicidal ideas. The patient is not nervous/anxious and does not have insomnia.        Objective:    Physical Exam  Constitutional: She is oriented to person, place, and time. She appears well-developed and well-nourished.  HENT:  Head: Normocephalic and atraumatic.  Eyes: Conjunctivae are normal. Pupils are equal, round, and reactive to light.  L eye -- strabismus  R eye --perla  ----   Neck: Normal range of motion. Neck supple. No JVD present. Carotid bruit is not present. No thyromegaly present.  Cardiovascular: Normal rate, regular rhythm and normal heart sounds.  No murmur heard. Pulmonary/Chest: Effort normal and breath sounds normal. No respiratory distress. She has no wheezes. She has no rales. She exhibits no tenderness.  Musculoskeletal: She exhibits no edema.  Neurological: She is alert and oriented to person, place, and time.  Psychiatric: She has a normal mood and affect.  Nursing note and vitals reviewed.   BP 108/80 (BP Location: Left Arm, Cuff Size: Normal)   Pulse 94   Temp 98.3 F (36.8 C) (Oral)   Resp 16   Ht '5\' 5"'$  (1.651 m)   Wt 126 lb (57.2 kg)   LMP 12/19/2017   SpO2 98%   BMI 20.97 kg/m  Wt Readings from Last 3 Encounters:  01/07/18 126 lb (57.2 kg)  06/14/17 123 lb 6.4 oz (56 kg)  05/19/16 129 lb 6.4 oz (58.7 kg)   BP Readings from Last 3 Encounters:  01/07/18 108/80  06/14/17 110/60  05/19/16 116/76   ekg--  Sinus tachy  Immunization History  Administered Date(s) Administered  . DTP 03/26/1995, 05/18/1995, 07/27/1995  . DTaP 07/29/1996, 01/31/1999  . HPV Quadrivalent 07/10/2006, 09/13/2006, 01/10/2007  . Hepatitis A 01/10/2007, 10/24/2007  . Hepatitis B 03/26/1995, 05/18/1995, 01/30/1996  . HiB (PRP-OMP) 03/26/1995, 05/18/1995, 07/27/1995, 07/29/1996  . IPV 01/31/1999  . Influenza Nasal 11/04/2010, 12/13/2011  . Influenza Whole 09/12/2003,  09/28/2004, 09/13/2006  . MMR 05/07/1996, 01/31/1999  . Meningococcal Polysaccharide 07/10/2006, 12/13/2011  . OPV 03/26/1995, 05/18/1995, 07/29/1996  . Tdap 07/12/2007  . Varicella 03/24/1997, 07/12/2007    Health Maintenance  Topic Date Due  . PAP SMEAR  01/21/2016  . INFLUENZA VACCINE  06/20/2017  . TETANUS/TDAP  07/11/2017  . HIV Screening  Completed    Lab Results  Component Value Date   WBC 7.4 06/14/2017   HGB 11.9 (L) 06/14/2017   HCT 35.5 (L) 06/14/2017   PLT 365.0 06/14/2017   GLUCOSE 70 07/10/2017   CHOL 158 07/10/2017   TRIG 115.0 07/10/2017   HDL 54.70 07/10/2017   LDLCALC 80 07/10/2017   ALT 23 07/10/2017   AST 28 07/10/2017   NA 140 07/10/2017   K 3.7 07/10/2017   CL 105 07/10/2017  CREATININE 0.73 07/10/2017   BUN 8 07/10/2017   CO2 32 07/10/2017   TSH 2.84 06/14/2017   HGBA1C 5.2 03/15/2015    Lab Results  Component Value Date   TSH 2.84 06/14/2017   Lab Results  Component Value Date   WBC 7.4 06/14/2017   HGB 11.9 (L) 06/14/2017   HCT 35.5 (L) 06/14/2017   MCV 94.7 06/14/2017   PLT 365.0 06/14/2017   Lab Results  Component Value Date   NA 140 07/10/2017   K 3.7 07/10/2017   CO2 32 07/10/2017   GLUCOSE 70 07/10/2017   BUN 8 07/10/2017   CREATININE 0.73 07/10/2017   BILITOT 0.5 07/10/2017   ALKPHOS 36 (L) 07/10/2017   AST 28 07/10/2017   ALT 23 07/10/2017   PROT 7.5 07/10/2017   ALBUMIN 4.3 07/10/2017   CALCIUM 9.8 07/10/2017   ANIONGAP 8 03/12/2015   GFR 105.51 07/10/2017   Lab Results  Component Value Date   CHOL 158 07/10/2017   Lab Results  Component Value Date   HDL 54.70 07/10/2017   Lab Results  Component Value Date   LDLCALC 80 07/10/2017   Lab Results  Component Value Date   TRIG 115.0 07/10/2017   Lab Results  Component Value Date   CHOLHDL 3 07/10/2017   Lab Results  Component Value Date   HGBA1C 5.2 03/15/2015         Assessment & Plan:   Problem List Items Addressed This Visit       Unprioritized   Nonintractable headache    opth tomorrow Consider CT head if no relief with tylenol f/u       Relevant Orders   CBC with Differential/Platelet   Comprehensive metabolic panel   TSH   Sedimentation rate   Palpitation    Sinus tach on ekg ? Anxiety Check labs       Relevant Orders   EKG 12-Lead (Completed)   CBC with Differential/Platelet   Comprehensive metabolic panel   TSH   Sedimentation rate    Other Visit Diagnoses    Vision loss of right eye    -  Primary   Relevant Orders   Ambulatory referral to Ophthalmology   CBC with Differential/Platelet   Comprehensive metabolic panel   TSH   Sedimentation rate   Lazy eye of left side       Relevant Orders   Ambulatory referral to Ophthalmology      I have discontinued Pura Litsey's BLISOVI 24 FE. I am also having her maintain her ALTAVERA.  No orders of the defined types were placed in this encounter.   CMA served as Education administrator during this visit. History, Physical and Plan performed by medical provider. Documentation and orders reviewed and attested to.  Ann Held, DO

## 2018-01-07 NOTE — Assessment & Plan Note (Signed)
opth tomorrow Consider CT head if no relief with tylenol f/u

## 2018-01-07 NOTE — Patient Instructions (Signed)
Blurred Vision Having blurred vision means that you cannot see things clearly. Your vision may seem fuzzy or out of focus. Blurred vision is a very common symptom of an eye or vision problem. Blurred vision is often a gradual blur that occurs in one eye or both eyes. There are many causes of blurred vision, including cataracts, macular degeneration, and diabetic retinopathy. Blurred vision can be diagnosed based on your symptoms and a physical exam. Tell your health care provider about any other health problems you have, any recent eye injury, and any prior surgeries. You may need to see a health care provider who specializes in eye problems (ophthalmologist). Your treatment depends on what is causing your blurred vision.  HOME CARE INSTRUCTIONS  Tell your health care provider about any changes in your blurred vision.  Do not drive or operate heavy machinery if your vision is blurry.  Keep all follow-up visits as directed by your health care provider. This is important. SEEK MEDICAL CARE IF:  Your symptoms get worse.  You have new symptoms.  You have trouble seeing at night.  You have trouble seeing up close or far away.  You have trouble noticing the difference between colors. SEEK IMMEDIATE MEDICAL CARE IF:  You have severe eye pain.  You have a severe headache.  You have flashing lights in your field of vision.  You have a sudden change in vision.  You have a sudden loss of vision.  You have vision change after an injury.  You notice drainage coming from your eyes.  You notice a rash around your eyes. This information is not intended to replace advice given to you by your health care provider. Make sure you discuss any questions you have with your health care provider. Document Released: 11/09/2003 Document Revised: 03/23/2015 Document Reviewed: 09/30/2014 Elsevier Interactive Patient Education  2017 [employer] Inc[employer]  

## 2018-01-08 LAB — CBC WITH DIFFERENTIAL/PLATELET
Basophils Absolute: 0 10*3/uL (ref 0.0–0.1)
Basophils Relative: 0.7 % (ref 0.0–3.0)
EOS ABS: 0 10*3/uL (ref 0.0–0.7)
Eosinophils Relative: 0.7 % (ref 0.0–5.0)
HEMATOCRIT: 44.3 % (ref 36.0–46.0)
HEMOGLOBIN: 15 g/dL (ref 12.0–15.0)
LYMPHS PCT: 21.8 % (ref 12.0–46.0)
Lymphs Abs: 1.5 10*3/uL (ref 0.7–4.0)
MCHC: 33.9 g/dL (ref 30.0–36.0)
MCV: 92.3 fl (ref 78.0–100.0)
MONO ABS: 0.4 10*3/uL (ref 0.1–1.0)
Monocytes Relative: 5.5 % (ref 3.0–12.0)
Neutro Abs: 4.8 10*3/uL (ref 1.4–7.7)
Neutrophils Relative %: 71.3 % (ref 43.0–77.0)
PLATELETS: 343 10*3/uL (ref 150.0–400.0)
RBC: 4.8 Mil/uL (ref 3.87–5.11)
RDW: 13.4 % (ref 11.5–15.5)
WBC: 6.7 10*3/uL (ref 4.0–10.5)

## 2018-01-08 LAB — COMPREHENSIVE METABOLIC PANEL
ALBUMIN: 4.7 g/dL (ref 3.5–5.2)
ALT: 20 U/L (ref 0–35)
AST: 25 U/L (ref 0–37)
Alkaline Phosphatase: 27 U/L — ABNORMAL LOW (ref 39–117)
BUN: 8 mg/dL (ref 6–23)
CALCIUM: 10.2 mg/dL (ref 8.4–10.5)
CHLORIDE: 104 meq/L (ref 96–112)
CO2: 28 meq/L (ref 19–32)
CREATININE: 0.72 mg/dL (ref 0.40–1.20)
GFR: 106.72 mL/min (ref >60.00)
Glucose, Bld: 103 mg/dL — ABNORMAL HIGH (ref 70–99)
Potassium: 4.7 mEq/L (ref 3.5–5.1)
Sodium: 139 mEq/L (ref 135–145)
Total Bilirubin: 0.4 mg/dL (ref 0.2–1.2)
Total Protein: 7.9 g/dL (ref 6.0–8.3)

## 2018-01-08 LAB — SEDIMENTATION RATE: SED RATE: 1 mm/h (ref 0–20)

## 2018-01-08 LAB — TSH: TSH: 3.67 u[IU]/mL (ref 0.35–4.50)

## 2018-01-14 ENCOUNTER — Telehealth: Payer: Self-pay | Admitting: *Deleted

## 2018-01-14 ENCOUNTER — Encounter: Payer: Self-pay | Admitting: Family Medicine

## 2018-01-14 NOTE — Telephone Encounter (Signed)
Noted  
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
## 2018-01-15 NOTE — Telephone Encounter (Signed)
It depends on her insurance ---- I think she would need to call her insurance company  ----[place] is out of the office but may be able to help with this when she is back

## 2018-05-30 ENCOUNTER — Telehealth: Payer: Self-pay | Admitting: Nurse Practitioner

## 2018-05-30 ENCOUNTER — Telehealth: Payer: Self-pay

## 2018-05-30 NOTE — Telephone Encounter (Signed)
Copied from CRM [id number]. Topic: Appointment Scheduling - Scheduling Inquiry for Clinic >> May 30, 2018  1:56 PM Tamela OddiMartin, Haley Combs, Haley Combs wrote: Reason for CRM: Patient mother Haley Combs called and stats the patient would like to transfer care from Dr. Laury AxonLowne- Haley Combs to Haley Combs. Patient is requesting a transfer due to location. When the transfer is approved please schedule appt . CB# [phone]

## 2018-05-30 NOTE — Telephone Encounter (Signed)
Fine with me

## 2018-05-30 NOTE — Telephone Encounter (Signed)
Copied from CRM [id number]. Topic: Appointment Scheduling - Scheduling Inquiry for Clinic >> May 30, 2018  1:59 PM Tamela OddiMartin, Don'Quashia, NT wrote: Reason for CRM: Patient mother Elease Hashimotoatricia called and stats the patient would like to transfer care [facility] Patient is requesting a transfer due to location. When the transfer is approved please schedule appt . CB# [phone]

## 2018-05-30 NOTE — Telephone Encounter (Signed)
Please advise 

## 2018-05-30 NOTE — Telephone Encounter (Signed)
Patient's mother is requesting a transfer from Dr Zola ButtonLowne-Chase to [facility] due to location.  Dr. Zola ButtonLowne-Chase, is this okay with you? Ashleigh, is this okay with you?

## 2018-05-30 NOTE — Telephone Encounter (Signed)
Okay with me 

## 2018-05-31 NOTE — Telephone Encounter (Signed)
Appointment scheduled.

## 2018-05-31 NOTE — Telephone Encounter (Signed)
NP appt scheduled 07/04/2018.

## 2018-06-24 DIAGNOSIS — L7211 Pilar cyst: Secondary | ICD-10-CM | POA: Diagnosis not present

## 2018-06-24 DIAGNOSIS — D225 Melanocytic nevi of trunk: Secondary | ICD-10-CM | POA: Diagnosis not present

## 2018-07-04 ENCOUNTER — Encounter: Payer: Self-pay | Admitting: Nurse Practitioner

## 2018-07-04 ENCOUNTER — Ambulatory Visit (INDEPENDENT_AMBULATORY_CARE_PROVIDER_SITE_OTHER): Payer: BLUE CROSS/BLUE SHIELD | Admitting: Nurse Practitioner

## 2018-07-04 VITALS — BP 132/84 | HR 91 | Temp 99.3°F | Resp 16 | Ht 65.0 in | Wt 126.0 lb

## 2018-07-04 DIAGNOSIS — R1012 Left upper quadrant pain: Secondary | ICD-10-CM | POA: Diagnosis not present

## 2018-07-04 DIAGNOSIS — Z0001 Encounter for general adult medical examination with abnormal findings: Secondary | ICD-10-CM | POA: Diagnosis not present

## 2018-07-04 DIAGNOSIS — K5900 Constipation, unspecified: Secondary | ICD-10-CM | POA: Diagnosis not present

## 2018-07-04 DIAGNOSIS — Z23 Encounter for immunization: Secondary | ICD-10-CM | POA: Diagnosis not present

## 2018-07-04 DIAGNOSIS — Z13220 Encounter for screening for lipoid disorders: Secondary | ICD-10-CM

## 2018-07-04 DIAGNOSIS — K625 Hemorrhage of anus and rectum: Secondary | ICD-10-CM

## 2018-07-04 NOTE — Progress Notes (Signed)
Name: Haley Combs   MRN: [id number]    DOB: 01/20/1996   Date:07/04/2018       Progress Note  Subjective  Chief Complaint  Chief Complaint  Patient presents with  . Establish Care    CPE, not fasting    HPI  Haley Combs is here today to establish as a transfer from another [facility] practice due to location. Aside from primary care, she is routinely followed by GYN for annual women's healthcare and birth control maintenance, and Dermatology for routine skin checks. She currently lives at home with her parents who follow a strict vegetarian diet, which she has also been following for past year. She just started a new job, working full time ,and has not found a lot of time to work out recently, but does try to work out at least once per week, and regularly walks her dog. During the visit today, she does c/o some intermittent LUQ abdominal pain, nausea, constipation, hard stool and has noted occasional scant blood on toilet tissue after straining for BM, says all of these symptoms have been occurring for some time now, as well as feeling like she can not gain weight since she has been following a vegetarian diet  USPSTF grade A and B recommendations  Depression: denies Depression screen [facility] 2[facility] 06/14/2017 05/19/2016  Decreased Interest 0 0  Down, Depressed, Hopeless 0 0  PHQ - 2 Score 0 0   Hypertension: BP Readings from Last 3 Encounters:  07/04/18 132/84  01/07/18 108/80  06/14/17 110/60   Obesity: Wt Readings from Last 3 Encounters:  07/04/18 126 lb (57.2 kg)  01/07/18 126 lb (57.2 kg)  06/14/17 123 lb 6.4 oz (56 kg)   BMI Readings from Last 3 Encounters:  07/04/18 20.97 kg/m  01/07/18 20.97 kg/m  06/14/17 20.53 kg/m    Alcohol: occasional social drink Tobacco use: no, never  HIV screening: declines, done in past  Intimate partner violence: denies, feels safe  Vaccinations: TDAP today  Cervical cancer screening: follows with GYN for routine womens health care, she has scheduled  upcoming GYN appt for routine PAP  Lipids: update lipid panel today Lab Results  Component Value Date   CHOL 158 07/10/2017   CHOL 208 (H) 06/14/2017   CHOL 168 05/19/2016   Lab Results  Component Value Date   HDL 54.70 07/10/2017   HDL 56.20 06/14/2017   HDL 53.90 05/19/2016   Lab Results  Component Value Date   LDLCALC 80 07/10/2017   LDLCALC 133 (H) 06/14/2017   LDLCALC 103 (H) 05/19/2016   Lab Results  Component Value Date   TRIG 115.0 07/10/2017   TRIG 98.0 06/14/2017   TRIG 57.0 05/19/2016   Lab Results  Component Value Date   CHOLHDL 3 07/10/2017   CHOLHDL 4 06/14/2017   CHOLHDL 3 05/19/2016   No results found for: LDLDIRECT  Glucose:  Glucose, Bld  Date Value Ref Range Status  01/07/2018 103 (H) 70 - 99 mg/dL Final  52/84/132408/21/2018 70 70 - 99 mg/dL Final  40/10/272507/26/2018 86 70 - 99 mg/dL Final   Skin cancer: follows with dermatology for annual skin check  Patient Active Problem List   Diagnosis Date Noted  . Palpitation 01/07/2018  . Nonintractable headache 01/07/2018  . Scoliosis 03/07/2013  . Asthma, chronic 03/07/2013  . GERD (gastroesophageal reflux disease) 03/07/2013  . VIRAL URI 01/02/2010    Past Surgical History:  Procedure Laterality Date  . TOOTH EXTRACTION    . TYMPANOSTOMY TUBE PLACEMENT  Family History  Problem Relation Age of Onset  . Hypertension Father        Not Medicated  . Hyperlipidemia Father   . Alcohol abuse Sister   . Drug abuse Sister   . Alcohol abuse Paternal Uncle   . Mental illness Paternal Uncle        Schizophrania  . Hypertension Paternal Grandmother   . Hypertension Paternal Grandfather   . Stroke Paternal Grandfather   . Heart disease Paternal Grandfather        Pacemaker, Ruptured Aorta  . Dementia Maternal Grandmother   . Colon cancer Neg Hx   . Colon polyps Neg Hx     Social History   Socioeconomic History  . Marital status: Single    Spouse name: Not on file  . Number of children: 0  . Years  of education: Not on file  . Highest education level: Not on file  Occupational History  . Not on file  Social Needs  . Financial resource strain: Not on file  . Food insecurity:    Worry: Not on file    Inability: Not on file  . Transportation needs:    Medical: Not on file    Non-medical: Not on file  Tobacco Use  . Smoking status: Never Smoker  . Smokeless tobacco: Never Used  Substance and Sexual Activity  . Alcohol use: No  . Drug use: No  . Sexual activity: Not Currently    Partners: Male    Birth control/protection: Condom, Pill  Lifestyle  . Physical activity:    Days per week: Not on file    Minutes per session: Not on file  . Stress: Not on file  Relationships  . Social connections:    Talks on phone: Not on file    Gets together: Not on file    Attends religious service: Not on file    Active member of club or organization: Not on file    Attends meetings of clubs or organizations: Not on file    Relationship status: Not on file  . Intimate partner violence:    Fear of current or ex partner: Not on file    Emotionally abused: Not on file    Physically abused: Not on file    Forced sexual activity: Not on file  Other Topics Concern  . Not on file  Social History Narrative   Exercise-- y 3days a week   Rare caffeine      Current Outpatient Medications:  .  ALTAVERA 0.15-30 MG-MCG tablet, Take 1 tablet by mouth daily., Disp: , Rfl: 0  No Known Allergies   Review of Systems  Constitutional: Negative for chills and fever.  Respiratory: Negative for cough and shortness of breath.   Cardiovascular: Negative for chest pain.  Gastrointestinal: Positive for abdominal pain, blood in stool, constipation and nausea.  Genitourinary: Negative for dysuria, frequency and urgency.  Musculoskeletal: Negative for falls.  Skin: Negative for rash.  Neurological: Negative for dizziness and headaches.    Objective  Vitals:   07/04/18 1604  BP: 132/84  Pulse: 91   Resp: 16  Temp: 99.3 F (37.4 C)  TempSrc: Oral  SpO2: 98%  Weight: 126 lb (57.2 kg)  Height: 5\' 5"  (1.651 m)    Body mass index is 20.97 kg/m.  Physical Exam Vital signs reviewed. Constitutional: Patient appears well-developed and well-nourished. No distress.  HENT: Head: Normocephalic and atraumatic. Ears: B TMs ok, no erythema or effusion; Nose: Nose normal. Mouth/Throat:  Oropharynx is clear and moist. No oropharyngeal exudate.  Eyes: Conjunctivae and EOM are normal. Pupils are equal, round, and reactive to light. No scleral icterus.  Neck: Normal range of motion. Neck supple. No cervical adenopathy. No thyromegaly present.  Cardiovascular: Normal rate, regular rhythm and normal heart sounds.  No murmur heard. No BLE edema. Distal pulses intact. Pulmonary/Chest: Effort normal and breath sounds normal. No respiratory distress. Abdominal: Soft. Bowel sounds are normal, no distension. There is no tenderness. no masses Breast: defd to GYN FEMALE GENITALIA:  defd to GYN Musculoskeletal: Normal range of motion, No gross deformities Neurological: she is alert and oriented to person, place, and time. No cranial nerve deficit. Coordination, balance, strength, speech and gait are normal.  Skin: Skin is warm and dry. No rash noted. No erythema.  Psychiatric: Patient has a normal mood and affect. behavior is normal. Judgment and thought content normal.   Assessment & Plan RTC in 1 year for CPE with fasting lab work  Left upper quadrant pain, Constipation, unspecified constipation, Rectal bleeding type Discussed home management of constipation including increasing fluid and fiber intake, regular exercise Suspect vegetarian diet could plan some role in her symptoms Diagnostic testing ordered today F/U with further recommendations pending lab results - CBC; Future - Comprehensive metabolic panel; Future - Lipase; Future - Urinalysis, Routine w reflex microscopic; Future - TSH;  Future

## 2018-07-04 NOTE — Assessment & Plan Note (Signed)
-  USPSTF grade A and B recommendations reviewed with patient; age-appropriate recommendations, preventive care, screening tests, etc discussed and encouraged; healthy living encouraged; see AVS for patient education given to patient -Discussed importance of 150 minutes of physical activity weekly, eat 6 servings of fruit/vegetables daily and drink plenty of water and avoid sweet beverages.  -Red flags and when to present for emergency care or RTC  reviewed with patient at time of visit. Follow up and care instructions discussed and provided in AVS.  -Reviewed Health Maintenance:  F/U with GYN as scheduled for routine PAP Need for Tdap vaccination- Tdap vaccine greater than or equal to 7yo IM  Screening for cholesterol level- Lipid panel; Future-she will return for labs when fasting

## 2018-07-04 NOTE — Patient Instructions (Addendum)
Please return downstairs for lab work when fasting. If any of your test results are critically abnormal, you will be contacted right away. Otherwise, I will contact you within a week about your test results and follow up recommendations  I will plan to see you back in 1 year for annual physical with fasting lab work, or sooner if needed.  Health Maintenance, Female Adopting a healthy lifestyle and getting preventive care can go a long way to promote health and wellness. Talk with your health care provider about what schedule of regular examinations is right for you. This is a good chance for you to check in with your provider about disease prevention and staying healthy. In between checkups, there are plenty of things you can do on your own. Experts have done a lot of research about which lifestyle changes and preventive measures are most likely to keep you healthy. Ask your health care provider for more information. Weight and diet Eat a healthy diet  Be sure to include plenty of vegetables, fruits, low-fat dairy products, and lean protein.  Do not eat a lot of foods high in solid fats, added sugars, or salt.  Get regular exercise. This is one of the most important things you can do for your health. ? Most adults should exercise for at least 150 minutes each week. The exercise should increase your heart rate and make you sweat (moderate-intensity exercise). ? Most adults should also do strengthening exercises at least twice a week. This is in addition to the moderate-intensity exercise.  Maintain a healthy weight  Body mass index (BMI) is a measurement that can be used to identify possible weight problems. It estimates body fat based on height and weight. Your health care provider can help determine your BMI and help you achieve or maintain a healthy weight.  For females 23 years of age and older: ? A BMI below 18.5 is considered underweight. ? A BMI of 18.5 to 24.9 is normal. ? A BMI of  25 to 29.9 is considered overweight. ? A BMI of 30 and above is considered obese.  Watch levels of cholesterol and blood lipids  You should start having your blood tested for lipids and cholesterol at 23 years of age, then have this test every 5 years.  You may need to have your cholesterol levels checked more often if: ? Your lipid or cholesterol levels are high. ? You are older than 23 years of age. ? You are at high risk for heart disease.  Cancer screening Lung Cancer  Lung cancer screening is recommended for adults 78-41 years old who are at high risk for lung cancer because of a history of smoking.  A yearly low-dose CT scan of the lungs is recommended for people who: ? Currently smoke. ? Have quit within the past 15 years. ? Have at least a 30-pack-year history of smoking. A pack year is smoking an average of one pack of cigarettes a day for 1 year.  Yearly screening should continue until it has been 15 years since you quit.  Yearly screening should stop if you develop a health problem that would prevent you from having lung cancer treatment.  Breast Cancer  Practice breast self-awareness. This means understanding how your breasts normally appear and feel.  It also means doing regular breast self-exams. Let your health care provider know about any changes, no matter how small.  If you are in your 20s or 30s, you should have a clinical breast exam (CBE)  by a health care provider every 1-3 years as part of a regular health exam.  If you are 66 or older, have a CBE every year. Also consider having a breast X-ray (mammogram) every year.  If you have a family history of breast cancer, talk to your health care provider about genetic screening.  If you are at high risk for breast cancer, talk to your health care provider about having an MRI and a mammogram every year.  Breast cancer gene (BRCA) assessment is recommended for women who have family members with BRCA-related  cancers. BRCA-related cancers include: ? Breast. ? Ovarian. ? Tubal. ? Peritoneal cancers.  Results of the assessment will determine the need for genetic counseling and BRCA1 and BRCA2 testing.  Cervical Cancer Your health care provider may recommend that you be screened regularly for cancer of the pelvic organs (ovaries, uterus, and vagina). This screening involves a pelvic examination, including checking for microscopic changes to the surface of your cervix (Pap test). You may be encouraged to have this screening done every 3 years, beginning at age 88.  For women ages 79-65, health care providers may recommend pelvic exams and Pap testing every 3 years, or they may recommend the Pap and pelvic exam, combined with testing for human papilloma virus (HPV), every 5 years. Some types of HPV increase your risk of cervical cancer. Testing for HPV may also be done on women of any age with unclear Pap test results.  Other health care providers may not recommend any screening for nonpregnant women who are considered low risk for pelvic cancer and who do not have symptoms. Ask your health care provider if a screening pelvic exam is right for you.  If you have had past treatment for cervical cancer or a condition that could lead to cancer, you need Pap tests and screening for cancer for at least 20 years after your treatment. If Pap tests have been discontinued, your risk factors (such as having a new sexual partner) need to be reassessed to determine if screening should resume. Some women have medical problems that increase the chance of getting cervical cancer. In these cases, your health care provider may recommend more frequent screening and Pap tests.  Colorectal Cancer  This type of cancer can be detected and often prevented.  Routine colorectal cancer screening usually begins at 23 years of age and continues through 23 years of age.  Your health care provider may recommend screening at an  earlier age if you have risk factors for colon cancer.  Your health care provider may also recommend using home test kits to check for hidden blood in the stool.  A small camera at the end of a tube can be used to examine your colon directly (sigmoidoscopy or colonoscopy). This is done to check for the earliest forms of colorectal cancer.  Routine screening usually begins at age 35.  Direct examination of the colon should be repeated every 5-10 years through 23 years of age. However, you may need to be screened more often if early forms of precancerous polyps or small growths are found.  Skin Cancer  Check your skin from head to toe regularly.  Tell your health care provider about any new moles or changes in moles, especially if there is a change in a mole's shape or color.  Also tell your health care provider if you have a mole that is larger than the size of a pencil eraser.  Always use sunscreen. Apply sunscreen liberally and  repeatedly throughout the day.  Protect yourself by wearing long sleeves, pants, a wide-brimmed hat, and sunglasses whenever you are outside.  Heart disease, diabetes, and high blood pressure  High blood pressure causes heart disease and increases the risk of stroke. High blood pressure is more likely to develop in: ? People who have blood pressure in the high end of the normal range (130-139/85-89 mm Hg). ? People who are overweight or obese. ? People who are African American.  If you are 79-38 years of age, have your blood pressure checked every 3-5 years. If you are 66 years of age or older, have your blood pressure checked every year. You should have your blood pressure measured twice-once when you are at a hospital or clinic, and once when you are not at a hospital or clinic. Record the average of the two measurements. To check your blood pressure when you are not at a hospital or clinic, you can use: ? An automated blood pressure machine at a  pharmacy. ? A home blood pressure monitor.  If you are between 55 years and 77 years old, ask your health care provider if you should take aspirin to prevent strokes.  Have regular diabetes screenings. This involves taking a blood sample to check your fasting blood sugar level. ? If you are at a normal weight and have a low risk for diabetes, have this test once every three years after 23 years of age. ? If you are overweight and have a high risk for diabetes, consider being tested at a younger age or more often. Preventing infection Hepatitis B  If you have a higher risk for hepatitis B, you should be screened for this virus. You are considered at high risk for hepatitis B if: ? You were born in a country where hepatitis B is common. Ask your health care provider which countries are considered high risk. ? Your parents were born in a high-risk country, and you have not been immunized against hepatitis B (hepatitis B vaccine). ? You have HIV or AIDS. ? You use needles to inject street drugs. ? You live with someone who has hepatitis B. ? You have had sex with someone who has hepatitis B. ? You get hemodialysis treatment. ? You take certain medicines for conditions, including cancer, organ transplantation, and autoimmune conditions.  Hepatitis C  Blood testing is recommended for: ? Everyone born from [birth year] through 1966. ? Anyone with known risk factors for hepatitis C.  Sexually transmitted infections (STIs)  You should be screened for sexually transmitted infections (STIs) including gonorrhea and chlamydia if: ? You are sexually active and are younger than 23 years of age. ? You are older than 23 years of age and your health care provider tells you that you are at risk for this type of infection. ? Your sexual activity has changed since you were last screened and you are at an increased risk for chlamydia or gonorrhea. Ask your health care provider if you are at risk.  If you do not  have HIV, but are at risk, it may be recommended that you take a prescription medicine daily to prevent HIV infection. This is called pre-exposure prophylaxis (PrEP). You are considered at risk if: ? You are sexually active and do not regularly use condoms or know the HIV status of your partner(s). ? You take drugs by injection. ? You are sexually active with a partner who has HIV.  Talk with your health care provider about whether you  are at high risk of being infected with HIV. If you choose to begin PrEP, you should first be tested for HIV. You should then be tested every 3 months for as long as you are taking PrEP. Pregnancy  If you are premenopausal and you may become pregnant, ask your health care provider about preconception counseling.  If you may become pregnant, take 400 to 800 micrograms (mcg) of folic acid every day.  If you want to prevent pregnancy, talk to your health care provider about birth control (contraception). Osteoporosis and menopause  Osteoporosis is a disease in which the bones lose minerals and strength with aging. This can result in serious bone fractures. Your risk for osteoporosis can be identified using a bone density scan.  If you are 29 years of age or older, or if you are at risk for osteoporosis and fractures, ask your health care provider if you should be screened.  Ask your health care provider whether you should take a calcium or vitamin D supplement to lower your risk for osteoporosis.  Menopause may have certain physical symptoms and risks.  Hormone replacement therapy may reduce some of these symptoms and risks. Talk to your health care provider about whether hormone replacement therapy is right for you. Follow these instructions at home:  Schedule regular health, dental, and eye exams.  Stay current with your immunizations.  Do not use any tobacco products including cigarettes, chewing tobacco, or electronic cigarettes.  If you are pregnant,  do not drink alcohol.  If you are breastfeeding, limit how much and how often you drink alcohol.  Limit alcohol intake to no more than 1 drink per day for nonpregnant women. One drink equals 12 ounces of beer, 5 ounces of wine, or 1 ounces of hard liquor.  Do not use street drugs.  Do not share needles.  Ask your health care provider for help if you need support or information about quitting drugs.  Tell your health care provider if you often feel depressed.  Tell your health care provider if you have ever been abused or do not feel safe at home. This information is not intended to replace advice given to you by your health care provider. Make sure you discuss any questions you have with your health care provider. Document Released: 05/22/2011 Document Revised: 04/13/2016 Document Reviewed: 08/10/2015 Elsevier Interactive Patient Education  [employer].

## 2018-08-27 ENCOUNTER — Ambulatory Visit: Payer: BLUE CROSS/BLUE SHIELD | Admitting: Family

## 2018-08-27 ENCOUNTER — Encounter: Payer: Self-pay | Admitting: Family

## 2018-08-27 ENCOUNTER — Other Ambulatory Visit (INDEPENDENT_AMBULATORY_CARE_PROVIDER_SITE_OTHER): Payer: BLUE CROSS/BLUE SHIELD

## 2018-08-27 ENCOUNTER — Other Ambulatory Visit: Payer: Self-pay | Admitting: Family

## 2018-08-27 VITALS — BP 126/82 | HR 95 | Temp 98.0°F | Ht 65.0 in | Wt 128.0 lb

## 2018-08-27 DIAGNOSIS — R5383 Other fatigue: Secondary | ICD-10-CM | POA: Diagnosis not present

## 2018-08-27 DIAGNOSIS — D649 Anemia, unspecified: Secondary | ICD-10-CM | POA: Diagnosis not present

## 2018-08-27 DIAGNOSIS — E039 Hypothyroidism, unspecified: Secondary | ICD-10-CM

## 2018-08-27 LAB — VITAMIN B12: Vitamin B-12: 312 pg/mL (ref 211–911)

## 2018-08-27 LAB — CBC WITH DIFFERENTIAL/PLATELET
BASOS PCT: 0.3 % (ref 0.0–3.0)
Basophils Absolute: 0 10*3/uL (ref 0.0–0.1)
Eosinophils Absolute: 0.1 10*3/uL (ref 0.0–0.7)
Eosinophils Relative: 2.3 % (ref 0.0–5.0)
HCT: 44.5 % (ref 36.0–46.0)
Hemoglobin: 15.1 g/dL — ABNORMAL HIGH (ref 12.0–15.0)
LYMPHS ABS: 1.8 10*3/uL (ref 0.7–4.0)
Lymphocytes Relative: 33.7 % (ref 12.0–46.0)
MCHC: 33.9 g/dL (ref 30.0–36.0)
MCV: 92.4 fl (ref 78.0–100.0)
MONO ABS: 0.5 10*3/uL (ref 0.1–1.0)
Monocytes Relative: 9.1 % (ref 3.0–12.0)
NEUTROS PCT: 54.6 % (ref 43.0–77.0)
Neutro Abs: 2.8 10*3/uL (ref 1.4–7.7)
Platelets: 316 10*3/uL (ref 150.0–400.0)
RBC: 4.82 Mil/uL (ref 3.87–5.11)
RDW: 13.1 % (ref 11.5–15.5)
WBC: 5.2 10*3/uL (ref 4.0–10.5)

## 2018-08-27 LAB — COMPREHENSIVE METABOLIC PANEL
ALT: 28 U/L (ref 0–35)
AST: 19 U/L (ref 0–37)
Albumin: 4.6 g/dL (ref 3.5–5.2)
Alkaline Phosphatase: 29 U/L — ABNORMAL LOW (ref 39–117)
BUN: 6 mg/dL (ref 6–23)
CHLORIDE: 104 meq/L (ref 96–112)
CO2: 25 mEq/L (ref 19–32)
Calcium: 9.3 mg/dL (ref 8.4–10.5)
Creatinine, Ser: 0.8 mg/dL (ref 0.40–1.20)
GFR: 93.98 mL/min (ref >60.00)
GLUCOSE: 98 mg/dL (ref 70–99)
Potassium: 3.6 mEq/L (ref 3.5–5.1)
SODIUM: 137 meq/L (ref 135–145)
TOTAL PROTEIN: 7.6 g/dL (ref 6.0–8.3)
Total Bilirubin: 0.4 mg/dL (ref 0.2–1.2)

## 2018-08-27 LAB — IRON,TIBC AND FERRITIN PANEL
%SAT: 17 % (calc) (ref 16–45)
Ferritin: 38 ng/mL (ref 16–154)
Iron: 62 ug/dL (ref 40–190)
TIBC: 365 mcg/dL (calc) (ref 250–450)

## 2018-08-27 LAB — TSH: TSH: 6.89 u[IU]/mL — AB (ref 0.35–4.50)

## 2018-08-27 LAB — VITAMIN D 25 HYDROXY (VIT D DEFICIENCY, FRACTURES): VITD: 31.98 ng/mL (ref 30.00–100.00)

## 2018-08-27 NOTE — Progress Notes (Signed)
Haley Combs is a 22 y.o. female with the following history as recorded in [employer]:  Patient Active Problem List   Diagnosis Date Noted  . Encounter for general adult medical examination with abnormal findings 07/04/2018  . Palpitation 01/07/2018  . Nonintractable headache 01/07/2018  . Scoliosis 03/07/2013  . Asthma, chronic 03/07/2013  . GERD (gastroesophageal reflux disease) 03/07/2013    Current Outpatient Medications  Medication Sig Dispense Refill  . ALTAVERA 0.15-30 MG-MCG tablet Take 1 tablet by mouth daily.  0   No current facility-administered medications for this visit.     Allergies: Patient has no known allergies.  Past Medical History:  Diagnosis Date  . Acne   . Asthma    Weather Induced heat/cold  . GERD (gastroesophageal reflux disease)   . Scoliosis     Past Surgical History:  Procedure Laterality Date  . TOOTH EXTRACTION    . TYMPANOSTOMY TUBE PLACEMENT      Family History  Problem Relation Age of Onset  . Hypertension Father        Not Medicated  . Hyperlipidemia Father   . Alcohol abuse Sister   . Drug abuse Sister   . Alcohol abuse Paternal Uncle   . Mental illness Paternal Uncle        Schizophrania  . Hypertension Paternal Grandmother   . Hypertension Paternal Grandfather   . Stroke Paternal Grandfather   . Heart disease Paternal Grandfather        Pacemaker, Ruptured Aorta  . Dementia Maternal Grandmother   . Colon cancer Neg Hx   . Colon polyps Neg Hx     Social History   Tobacco Use  . Smoking status: Never Smoker  . Smokeless tobacco: Never Used  Substance Use Topics  . Alcohol use: No    Subjective:  Patient presents with concerns that she has been having episodes of feeling "light-headed" on and off for the past few months; notes that symptoms are more present at night; is concerned that she is anemic; is following a vegetarian diet- admits not eating much iron rich food; denies any concerns for underlying anxiety;   LMP- 3  weeks ago;     Objective:  Vitals:   08/27/18 0819  BP: 126/82  Pulse: 95  Temp: 98 F (36.7 C)  TempSrc: Oral  SpO2: 99%  Weight: 128 lb (58.1 kg)  Height: 5' 5"  (1.651 m)    General: Well developed, well nourished, in no acute distress  Skin : Warm and dry.  Head: Normocephalic and atraumatic  Eyes: Sclera and conjunctiva clear; pupils round and reactive to light; extraocular movements intact  Ears: External normal; canals clear; tympanic membranes normal  Oropharynx: Pink, supple. No suspicious lesions  Neck: Supple without thyromegaly, adenopathy  Lungs: Respirations unlabored; clear to auscultation bilaterally without wheeze, rales, rhonchi  CVS exam: normal rate and regular rhythm.  Abdomen: Soft; nontender; nondistended; normoactive bowel sounds; no masses or hepatosplenomegaly  Neurologic: Alert and oriented; speech intact; face symmetrical; moves all extremities well; CNII-XII intact without focal deficit   Assessment:  1. Other fatigue   2. Anemia, unspecified type     Plan:  ? Etiology- suspicious for anxiety component; will update labs today; follow-up to be determined.   No follow-ups on file.  Orders Placed This Encounter  Procedures  . CBC w/Diff    Standing Status:   Future    Standing Expiration Date:   08/27/2019  . Comp Met (CMET)    Standing Status:  Future    Standing Expiration Date:   08/27/2019  . TSH    Standing Status:   Future    Standing Expiration Date:   08/27/2019  . Vitamin D (25 hydroxy)    Standing Status:   Future    Standing Expiration Date:   08/27/2019  . B12    Standing Status:   Future    Standing Expiration Date:   08/27/2019  . Iron, TIBC and Ferritin Panel    Standing Status:   Future    Standing Expiration Date:   08/28/2019    Requested Prescriptions    No prescriptions requested or ordered in this encounter

## 2018-08-28 DIAGNOSIS — Z01419 Encounter for gynecological examination (general) (routine) without abnormal findings: Secondary | ICD-10-CM | POA: Diagnosis not present

## 2018-08-28 DIAGNOSIS — Z6821 Body mass index (BMI) 21.0-21.9, adult: Secondary | ICD-10-CM | POA: Diagnosis not present

## 2018-09-25 ENCOUNTER — Other Ambulatory Visit (INDEPENDENT_AMBULATORY_CARE_PROVIDER_SITE_OTHER): Payer: BLUE CROSS/BLUE SHIELD

## 2018-09-25 DIAGNOSIS — E039 Hypothyroidism, unspecified: Secondary | ICD-10-CM | POA: Diagnosis not present

## 2018-09-25 DIAGNOSIS — R899 Unspecified abnormal finding in specimens from other organs, systems and tissues: Secondary | ICD-10-CM | POA: Diagnosis not present

## 2018-09-25 LAB — TSH: TSH: 5.71 u[IU]/mL — ABNORMAL HIGH (ref 0.35–4.50)

## 2018-09-26 LAB — THYROID PROFILE - CHCC
FREE THYROXINE INDEX: 2.4 (ref 1.4–3.8)
T3 UPTAKE: 30 % (ref 22–35)
T4, Total: 7.9 ug/dL (ref 5.1–11.9)

## 2018-10-02 DIAGNOSIS — N9089 Other specified noninflammatory disorders of vulva and perineum: Secondary | ICD-10-CM | POA: Diagnosis not present

## 2018-10-02 DIAGNOSIS — A609 Anogenital herpesviral infection, unspecified: Secondary | ICD-10-CM | POA: Diagnosis not present

## 2018-10-02 DIAGNOSIS — N760 Acute vaginitis: Secondary | ICD-10-CM | POA: Diagnosis not present

## 2018-11-01 ENCOUNTER — Other Ambulatory Visit: Payer: Self-pay | Admitting: Family

## 2018-12-02 ENCOUNTER — Other Ambulatory Visit (INDEPENDENT_AMBULATORY_CARE_PROVIDER_SITE_OTHER): Payer: BLUE CROSS/BLUE SHIELD

## 2018-12-02 ENCOUNTER — Encounter: Payer: Self-pay | Admitting: Family

## 2018-12-02 ENCOUNTER — Other Ambulatory Visit: Payer: Self-pay | Admitting: Family

## 2018-12-02 DIAGNOSIS — E039 Hypothyroidism, unspecified: Secondary | ICD-10-CM

## 2018-12-02 DIAGNOSIS — K5900 Constipation, unspecified: Secondary | ICD-10-CM

## 2018-12-02 LAB — TSH: TSH: 5.53 u[IU]/mL — ABNORMAL HIGH (ref 0.35–4.50)

## 2018-12-03 ENCOUNTER — Ambulatory Visit
Admission: RE | Admit: 2018-12-03 | Discharge: 2018-12-03 | Disposition: A | Discharge status: Home / Self Care | Payer: BLUE CROSS/BLUE SHIELD | Source: Ambulatory Visit | Attending: Family | Admitting: Family

## 2018-12-03 DIAGNOSIS — E039 Hypothyroidism, unspecified: Secondary | ICD-10-CM | POA: Diagnosis not present

## 2018-12-04 ENCOUNTER — Other Ambulatory Visit: Payer: BLUE CROSS/BLUE SHIELD

## 2018-12-04 DIAGNOSIS — E039 Hypothyroidism, unspecified: Secondary | ICD-10-CM

## 2018-12-05 LAB — THYROID PEROXIDASE ANTIBODY: THYROID PEROXIDASE ANTIBODY: 1 [IU]/mL (ref <9)

## 2018-12-05 LAB — THYROGLOBULIN ANTIBODY

## 2018-12-05 LAB — THYROID PROFILE - CHCC
Free Thyroxine Index: 2.3 (ref 1.4–3.8)
T3 Uptake: 31 % (ref 22–35)
T4, Total: 7.5 ug/dL (ref 5.1–11.9)

## 2018-12-06 ENCOUNTER — Other Ambulatory Visit: Payer: Self-pay | Admitting: Family

## 2018-12-06 DIAGNOSIS — R7989 Other specified abnormal findings of blood chemistry: Secondary | ICD-10-CM

## 2019-01-10 ENCOUNTER — Ambulatory Visit: Payer: BLUE CROSS/BLUE SHIELD | Admitting: Internal Medicine

## 2019-01-10 ENCOUNTER — Encounter: Payer: Self-pay | Admitting: Internal Medicine

## 2019-01-10 VITALS — BP 110/70 | HR 118 | Ht 65.0 in | Wt 127.0 lb

## 2019-01-10 DIAGNOSIS — E038 Other specified hypothyroidism: Secondary | ICD-10-CM

## 2019-01-10 DIAGNOSIS — E039 Hypothyroidism, unspecified: Secondary | ICD-10-CM | POA: Diagnosis not present

## 2019-01-10 NOTE — Patient Instructions (Addendum)
Please come back in 4-5 weeks after stopping OCPs.  Please stop the multivitamin 2 days prior to the next labs.  Please come back for a follow-up appointment in 6 months.

## 2019-01-10 NOTE — Progress Notes (Signed)
Patient ID: Haley Combs, female   DOB: May 07, 1996, 23 y.o.   MRN: [id number]    HPI  Haley Combs is a 23 y.o.-year-old female, referred by her PCP, Evaristo Bury, NP for management of hypothyroidism.  Pt. has been dx with hypothyroidism in 08/2018 by screening. She is not on LT4 yet.   She remembers that in high school she had irreg. Menses >> found to have high testosterone >> started estrogen >> cycles became regular and she is now on OCPs.  She takes the thyroid hormone: - fasting - with water - separated by >30 min from b'fast  - no calcium, iron, PPIs, multivitamins  - on B complex (no biotin and MVI (1000 mcg Biotin)-last dose taken this morning!  I reviewed pt's thyroid tests: Component     Latest Ref Rng & Units 12/04/2018  T3 Uptake     22 - 35 % 31  Thyroxine (T4)     5.1 - 11.9 mcg/dL 7.5  Free Thyroxine Index     1.4 - 3.8 2.3   Lab Results  Component Value Date   TSH 5.53 (H) 12/02/2018   TSH 5.71 (H) 09/25/2018   TSH 6.89 (H) 08/27/2018   TSH 3.67 01/07/2018   TSH 2.84 06/14/2017   TSH 4.07 05/19/2016   TSH 1.29 03/10/2014   TSH 2.60 03/26/2013   Antithyroid antibodies were not elevated: Component     Latest Ref Rng & Units 12/04/2018  Thyroglobulin Ab     < or = 1 IU/mL <1  Thyroperoxidase Ab SerPl-aCnc     <9 IU/mL 1   Pt denies: - weight gain, but has had some weight loss - fatigue - depression - constipation  She has: - cold intolerance - hair loss  She she also mentions occasional palpitations, nausea especially after taking OCPs recently, itching - allergic.  Of note, she had a thyroid ultrasound (12/04/2018): Normal, without nodules.  Pt denies feeling nodules in neck, hoarseness, dysphagia/odynophagia, SOB with lying down.  She has + FH of thyroid disorders in: MGM, M aunts, M cousins. No FH of thyroid cancer.  No h/o radiation tx to head or neck. No recent use of iodine supplements.  Pt. is on OCPs.  Of note, she has a history of  elevated testosterone.  She also has food allergies.  She has a history of a 7-week miscarriage in 2018.  She was in between OCPs at that time.  She has a history of lipoma, salivary gland lithiasis, tooth implants.  ROS: Constitutional: + See HPI Eyes: no blurry vision, no xerophthalmia ENT: no sore throat, + see HPI Cardiovascular: no CP/SOB/+ palpitations/no leg swelling Respiratory: no cough/SOB Gastrointestinal: + N/no V/D/C Musculoskeletal: no muscle/joint aches Skin: no rashes, + itching- she has many food allergies Neurological: no tremors/numbness/tingling/dizziness Psychiatric: no depression/anxiety  Past Medical History:  Diagnosis Date  . Acne   . Asthma    Weather Induced heat/cold  . GERD (gastroesophageal reflux disease)   . Scoliosis    Past Surgical History:  Procedure Laterality Date  . TOOTH EXTRACTION    . TYMPANOSTOMY TUBE PLACEMENT     Social History   Socioeconomic History  . Marital status: Single    Spouse name: Not on file  . Number of children: 0  . Years of education: Not on file  . 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 Smoking status: Never Smoker  . Smokeless tobacco:  Never Used  Substance and Sexual Activity  . Alcohol use:  2 drinks 1-2 times a week  . Drug use: No  . Sexual activity: Not Currently    Partners: Male    Birth control/protection: Condom, Pill  Social History Narrative   Exercise-- y 3days a week   Rare caffeine    Current Outpatient Medications on File Prior to Visit  Medication Sig Dispense Refill  . ALTAVERA 0.15-30 MG-MCG tablet Take 1 tablet by mouth daily.  0   No current facility-administered medications on file prior to visit.    No Known Allergies Family History  Problem Relation Age of Onset  . Hypertension Father        Not Medicated  . Hyperlipidemia Father   . Alcohol abuse Sister   . Drug abuse Sister   . Alcohol abuse Paternal  Uncle   . Mental illness Paternal Uncle        Schizophrania  . Hypertension Paternal Grandmother   . Hypertension Paternal Grandfather   . Stroke Paternal Grandfather   . Heart disease Paternal Grandfather        Pacemaker, Ruptured Aorta  . Dementia Maternal Grandmother   . Colon cancer Neg Hx   . Colon polyps Neg Hx     PE: BP 110/70   Pulse (!) 118   Ht 5\' 5"  (1.651 m) Comment: measured  Wt 127 lb (57.6 kg)   LMP 12/27/2018   BMI 21.13 kg/m  Wt Readings from Last 3 Encounters:  01/10/19 127 lb (57.6 kg)  08/27/18 128 lb (58.1 kg)  07/04/18 126 lb (57.2 kg)   Constitutional: Normal weight, in NAD Eyes: PERRLA, EOMI, no exophthalmos ENT: moist mucous membranes, no thyromegaly, no cervical lymphadenopathy Cardiovascular: Tachycardia, RR, No MRG Respiratory: CTA B Gastrointestinal: abdomen soft, NT, ND, BS+ Musculoskeletal: no deformities, strength intact in all 4 Skin: moist, warm, no rashes Neurological: no tremor with outstretched hands, DTR normal in all 4  ASSESSMENT: 1.  Subclinical hypothyroidism  PLAN:  1. Patient with recently found elevated TSH with normal total T4 hormone, not on on levothyroxine therapy. - she appears euthyroid.  - We discussed about the definition of subclinical hypothyroidism and I explained the treatment is indicated in the following situations:  Desire for pregnancy  TSH higher than 10  TSH lower than 10 with signs or symptoms consistent with hypothyroidism - I explained that she does not have elevated antithyroid antibodies which would have indicated Hashimoto's thyroiditis. - For now, we decided to recheck her TFTs: TSH, free T4, free T3, however, she has a reaction to oral contraceptives (develops nausea every time she takes them) so she is planning to stop.  Since there may be an interference between estrogen and TFTs, we discussed about having her back to recheck her TFTs in approximately a month after she stops her estrogen.   Also, she is on biotin and I advised her to stop this at least 2 days prior to the next set of labs. - We discussed about correct intake of levothyroxine in case we need to start, fasting, with water, separated by at least 30 minutes from breakfast, and separated by more than 4 hours from calcium, iron, multivitamins, acid reflux medications (PPIs). - I will see her back in 6 months but we may need to repeat her TFTs sooner.  Orders Placed This Encounter  Procedures  . TSH  . T4, free  . T3, free   Carlus Pavlov, MD PhD [facility] Endocrinology

## 2019-01-28 ENCOUNTER — Encounter: Payer: Self-pay | Admitting: Nurse Practitioner

## 2019-01-28 ENCOUNTER — Ambulatory Visit: Payer: BLUE CROSS/BLUE SHIELD | Admitting: Nurse Practitioner

## 2019-01-28 VITALS — BP 130/90 | HR 102 | Temp 98.6°F | Ht 65.0 in | Wt 128.0 lb

## 2019-01-28 DIAGNOSIS — R21 Rash and other nonspecific skin eruption: Secondary | ICD-10-CM

## 2019-01-28 DIAGNOSIS — R221 Localized swelling, mass and lump, neck: Secondary | ICD-10-CM | POA: Diagnosis not present

## 2019-01-28 DIAGNOSIS — R5381 Other malaise: Secondary | ICD-10-CM

## 2019-01-28 DIAGNOSIS — R52 Pain, unspecified: Secondary | ICD-10-CM | POA: Diagnosis not present

## 2019-01-28 LAB — POCT RAPID STREP A (OFFICE): Rapid Strep A Screen: NEGATIVE

## 2019-01-28 MED ORDER — FAMOTIDINE 20 MG PO TABS: 20.0000 mg | ORAL_TABLET | Freq: Two times a day (BID) | ORAL | 1 refills | Status: DC

## 2019-01-28 MED ORDER — CETIRIZINE HCL 10 MG PO TABS: 10.0000 mg | ORAL_TABLET | Freq: Every day | ORAL | 1 refills | Status: DC

## 2019-01-28 NOTE — Progress Notes (Signed)
Haley Combs is a 23 y.o. female with the following history as recorded in Epic[facility]:  Patient Active Problem List   Diagnosis Date Noted  . Subclinical hypothyroidism 01/10/2019  . Encounter for general adult medical examination with abnormal findings 07/04/2018  . Palpitation 01/07/2018  . Nonintractable headache 01/07/2018  . Scoliosis 03/07/2013  . Asthma, chronic 03/07/2013  . GERD (gastroesophageal reflux disease) 03/07/2013    Current Outpatient Medications  Medication Sig Dispense Refill  . ALTAVERA 0.15-30 MG-MCG tablet Take 1 tablet by mouth daily.  0  . cetirizine (ZYRTEC) 10 MG tablet Take 1 tablet (10 mg total) by mouth daily. 30 tablet 1  . famotidine (PEPCID) 20 MG tablet Take 1 tablet (20 mg total) by mouth 2 (two) times daily. 30 tablet 1   No current facility-administered medications for this visit.     Allergies: Patient has no known allergies.  Past Medical History:  Diagnosis Date  . Acne   . Asthma    Weather Induced heat/cold  . GERD (gastroesophageal reflux disease)   . Scoliosis     Past Surgical History:  Procedure Laterality Date  . TOOTH EXTRACTION    . TYMPANOSTOMY TUBE PLACEMENT      Family History  Problem Relation Age of Onset  . Hypertension Father        Not Medicated  . Hyperlipidemia Father   . Alcohol abuse Sister   . Drug abuse Sister   . Alcohol abuse Paternal Uncle   . Mental illness Paternal Uncle        Schizophrania  . Hypertension Paternal Grandmother   . Hypertension Paternal Grandfather   . Stroke Paternal Grandfather   . Heart disease Paternal Grandfather        Pacemaker, Ruptured Aorta  . Dementia Maternal Grandmother   . Colon cancer Neg Hx   . Colon polyps Neg Hx     Social History   Tobacco Use  . Smoking status: Never Smoker  . Smokeless tobacco: Never Used  Substance Use Topics  . Alcohol use: No     Subjective:  Haley Combs is here today for acute visit, CC: rash. She tells me that over the past 4-5  days, shes had intermittent malaise, posterior neck pain, body aches, "feeling a lump in my throat.' She then developed a rash to her back yesterday, so she scheduled an e-visit last night and was given RX for amoxicillin for possible strep, but decided to come in today for additional eval and strep test prior to starting the amox No fevers, chills, headaches, vision changes, confusion, Sore throat, cough, cp, sob, abdominal, nausea, vomiting, numbness or tingling  ROS- See HPI  Objective:  Vitals:   01/28/19 1057  BP: 130/90  Pulse: (!) 102  Temp: 98.6 F (37 C)  TempSrc: Oral  SpO2: 99%  Weight: 128 lb (58.1 kg)  Height: 5\' 5"  (1.651 m)    General: Well developed, well nourished, in no acute distress  Skin : Warm and dry. Mildly erythematous diffuse rash to posterior trunk. Head: Normocephalic and atraumatic  Eyes: Sclera and conjunctiva clear; pupils round and reactive to light; extraocular movements intact  Oropharynx: Pink, supple. No suspicious lesions  Neck: Supple without thyromegaly, adenopathy  Lungs: Respirations unlabored; clear to auscultation bilaterally without wheeze, rales, rhonchi  CVS exam: normal rate, regular rhythm, normal S1, S2, no murmurs, rubs, clicks or gallops.  Musculoskeletal: No deformities; no active joint inflammation;normal ROM  Extremities: No edema, cyanosis, clubbing  Vessels: Symmetric bilaterally  Neurologic: Alert and oriented; speech intact; face symmetrical; moves all extremities well; CNII-XII intact without focal deficit  Psychiatric: Normal mood and affect.   Assessment:  1. Rash of back   2. Lump in throat   3. Malaise   4. Body aches     Plan:   POCT strep is negative Will treat rash with pepcid, zyrtec course - med dosing and side effects discussed Based on symptoms, I am going to have her proceed with amoxicillin course if she is no better in 2 days Home management, red flags and return precautions including when to seek  immediate care discussed and printed on AVS She will f/u for new, worsening symptoms or if symptoms do not resolve in 1 week  No follow-ups on file.  Orders Placed This Encounter  Procedures  . POCT rapid strep A    Requested Prescriptions   Signed Prescriptions Disp Refills  . famotidine (PEPCID) 20 MG tablet 30 tablet 1    Sig: Take 1 tablet (20 mg total) by mouth 2 (two) times daily.  . cetirizine (ZYRTEC) 10 MG tablet 30 tablet 1    Sig: Take 1 tablet (10 mg total) by mouth daily.

## 2019-01-28 NOTE — Patient Instructions (Addendum)
Take pepcid and zyrtec as prescribed  If rash does not improve, take amoxicillin  Please follow up for fevers over 101, if your symptoms get worse, or if your symptoms dont get better in 1 week.   Rash, Adult  A rash is a change in the color of your skin. A rash can also change the way your skin feels. There are many different conditions and factors that can cause a rash. Follow these instructions at home: The goal of treatment is to stop the itching and keep the rash from spreading. Watch for any changes in your symptoms. Let your doctor know about them. Follow these instructions to help with your condition: Medicine Take or apply over-the-counter and prescription medicines only as told by your doctor. These may include medicines:  To treat red or swollen skin (corticosteroid creams).  To treat itching.  To treat an allergy (oral antihistamines).  To treat very bad symptoms (oral corticosteroids).  Skin care  Put cool cloths (compresses) on the affected areas.  Do not scratch or rub your skin.  Avoid covering the rash. Make sure that the rash is exposed to air as much as possible. Managing itching and discomfort  Avoid hot showers or baths. These can make itching worse. A cold shower may help.  Try taking a bath with: ? Epsom salts. You can get these at your local pharmacy or grocery store. Follow the instructions on the package. ? Baking soda. Pour a small amount into the bath as told by your doctor. ? Colloidal oatmeal. You can get this at your local pharmacy or grocery store. Follow the instructions on the package.  Try putting baking soda paste onto your skin. Stir water into baking soda until it gets like a paste.  Try putting on a lotion that relieves itchiness (calamine lotion).  Keep cool and out of the sun. Sweating and being hot can make itching worse. General instructions   Rest as needed.  Drink enough fluid to keep your pee (urine) pale yellow.  Wear  loose-fitting clothing.  Avoid scented soaps, detergents, and perfumes. Use gentle soaps, detergents, perfumes, and other cosmetic products.  Avoid anything that causes your rash. Keep a journal to help track what causes your rash. Write down: ? What you eat. ? What cosmetic products you use. ? What you drink. ? What you wear. This includes jewelry.  Keep all follow-up visits as told by your doctor. This is important. Contact a doctor if:  You sweat at night.  You lose weight.  You pee (urinate) more than normal.  You pee less than normal, or you notice that your pee is a darker color than normal.  You feel weak.  You throw up (vomit).  Your skin or the whites of your eyes look yellow (jaundice).  Your skin: ? Tingles. ? Is numb.  Your rash: ? Does not go away after a few days. ? Gets worse.  You are: ? More thirsty than normal. ? More tired than normal.  You have: ? New symptoms. ? Pain in your belly (abdomen). ? A fever. ? Watery poop (diarrhea). Get help right away if:  You have a fever and your symptoms suddenly get worse.  You start to feel mixed up (confused).  You have a very bad headache or a stiff neck.  You have very bad joint pains or stiffness.  You have jerky movements that you cannot control (seizure).  Your rash covers all or most of your body. The rash  may or may not be painful.  You have blisters that: ? Are on top of the rash. ? Grow larger. ? Grow together. ? Are painful. ? Are inside your nose or mouth.  You have a rash that: ? Looks like purple pinprick-sized spots all over your body. ? Has a "bull's eye" or looks like a target. ? Is red and painful, causes your skin to peel, and is not from being in the sun too long. Summary  A rash is a change in the color of your skin. A rash can also change the way your skin feels.  The goal of treatment is to stop the itching and keep the rash from spreading.  Take or apply  over-the-counter and prescription medicines only as told by your doctor.  Contact a doctor if you have new symptoms or symptoms that get worse.  Keep all follow-up visits as told by your doctor. This is important. This information is not intended to replace advice given to you by your health care provider. Make sure you discuss any questions you have with your health care provider. Document Released: 04/24/2008 Document Revised: 06/10/2018 Document Reviewed: 06/10/2018 Elsevier Interactive Patient Education  2019 [employer].

## 2019-04-07 ENCOUNTER — Telehealth: Payer: Self-pay | Admitting: Internal Medicine

## 2019-04-07 NOTE — Telephone Encounter (Signed)
LVM for patientt to call back to make appt with Dr. Jonny Ruiz and cancel [place] appt.

## 2019-04-07 NOTE — Telephone Encounter (Signed)
-----   Message from Corwin Levins, MD sent at 03/22/2019  7:08 PM EDT ----- Regarding: transfer of care Ms Haley Combs or other Scheduler,  OK to contact pt to change PCP to me and Aug 2020 to me as well

## 2019-04-25 ENCOUNTER — Telehealth: Payer: Self-pay

## 2019-04-25 NOTE — Telephone Encounter (Signed)
Copied from CRM [id number]. Topic: Appointment Scheduling - Transfer of Care >> Apr 25, 2019 12:28 PM Leafy Ro wrote: Pt is requesting to transfer FROM: dr lowne-chase  Pt is requesting to transfer TO: Peggye Pitt Reason for requested transfer: [place] is closer Send CRM to patient's current PCP (transferring FROM).

## 2019-04-28 NOTE — Telephone Encounter (Signed)
Pt requests transfer from Dr. Etter Sjogren to Dr. Isaac Bliss.

## 2019-04-28 NOTE — Telephone Encounter (Signed)
Fine with me

## 2019-04-29 NOTE — Telephone Encounter (Signed)
Fine with me

## 2019-04-30 NOTE — Telephone Encounter (Signed)
Appointment scheduled.

## 2019-05-20 DIAGNOSIS — R0602 Shortness of breath: Secondary | ICD-10-CM | POA: Diagnosis not present

## 2019-05-20 DIAGNOSIS — J3081 Allergic rhinitis due to animal (cat) (dog) hair and dander: Secondary | ICD-10-CM | POA: Diagnosis not present

## 2019-05-20 DIAGNOSIS — J3089 Other allergic rhinitis: Secondary | ICD-10-CM | POA: Diagnosis not present

## 2019-05-20 DIAGNOSIS — J301 Allergic rhinitis due to pollen: Secondary | ICD-10-CM | POA: Diagnosis not present

## 2019-06-19 DIAGNOSIS — B360 Pityriasis versicolor: Secondary | ICD-10-CM | POA: Diagnosis not present

## 2019-06-19 MED FILL — KETOCONAZOLE 2% SHAMPOO: 2 | 30 days supply | Qty: 120 | Fill #0

## 2019-06-19 MED FILL — KETOCONAZOLE 200 MG TABS: 200 | 21 days supply | Qty: 6 | Fill #0

## 2019-07-11 ENCOUNTER — Encounter: Payer: BLUE CROSS/BLUE SHIELD | Admitting: Nurse Practitioner

## 2019-07-17 ENCOUNTER — Ambulatory Visit (INDEPENDENT_AMBULATORY_CARE_PROVIDER_SITE_OTHER): Payer: BC Managed Care – PPO | Admitting: Internal Medicine

## 2019-07-17 ENCOUNTER — Encounter: Payer: Self-pay | Admitting: Internal Medicine

## 2019-07-17 ENCOUNTER — Other Ambulatory Visit: Payer: Self-pay

## 2019-07-17 VITALS — BP 96/64 | HR 100 | Temp 97.7°F | Ht 65.0 in | Wt 125.7 lb

## 2019-07-17 DIAGNOSIS — E039 Hypothyroidism, unspecified: Secondary | ICD-10-CM

## 2019-07-17 DIAGNOSIS — E038 Other specified hypothyroidism: Secondary | ICD-10-CM

## 2019-07-17 DIAGNOSIS — Z23 Encounter for immunization: Secondary | ICD-10-CM

## 2019-07-17 DIAGNOSIS — Z0000 Encounter for general adult medical examination without abnormal findings: Secondary | ICD-10-CM

## 2019-07-17 LAB — TSH: TSH: 3.95 u[IU]/mL (ref 0.35–4.50)

## 2019-07-17 LAB — T4, FREE: Free T4: 0.8 ng/dL (ref 0.60–1.60)

## 2019-07-17 LAB — T3, FREE: T3, Free: 3.3 pg/mL (ref 2.3–4.2)

## 2019-07-17 LAB — VITAMIN B12: Vitamin B-12: 402 pg/mL (ref 211–911)

## 2019-07-17 NOTE — Progress Notes (Signed)
Established Patient Office Visit     CC/Reason for Visit: Annual preventive exam  HPI: Haley Combs is a 23 y.o. female who is coming in today for the above mentioned reasons. Past Medical History is significant for: Subclinical hypothyroidism who last saw endocrinology in February.  She is due for repeat thyroid function tests today.  Other than that she has no issues.  She graduated from college with a degree in business in 2018, is working in Development worker, community.  She is a never smoker, drinks alcohol only occasionally.  Her family history is significant for maternal grandmother with breast cancer and a paternal grandfather with coronary artery disease and diabetes.  She has been complaining of some "weak hips" that especially bother her after significant physical activity.  She has routine eye and dental care.  Will receive flu shot today.   Past Medical/Surgical History: Past Medical History:  Diagnosis Date  . Acne   . Asthma    Weather Induced heat/cold  . GERD (gastroesophageal reflux disease)   . Scoliosis     Past Surgical History:  Procedure Laterality Date  . TOOTH EXTRACTION    . TYMPANOSTOMY TUBE PLACEMENT      Social History:  reports that she has never smoked. She has never used smokeless tobacco. She reports that she does not drink alcohol or use drugs.  Allergies: No Known Allergies  Family History:  Family History  Problem Relation Age of Onset  . Hypertension Father        Not Medicated  . Hyperlipidemia Father   . Alcohol abuse Sister   . Drug abuse Sister   . Alcohol abuse Paternal Uncle   . Mental illness Paternal Uncle        Schizophrania  . Hypertension Paternal Grandmother   . Hypertension Paternal Grandfather   . Stroke Paternal Grandfather   . Heart disease Paternal Grandfather        Pacemaker, Ruptured Aorta  . Dementia Maternal Grandmother   . Colon cancer Neg Hx   . Colon polyps Neg Hx      Current Outpatient Medications:   .  KETOCONAZOLE, TOPICAL, 1 % SHAM, Apply topically., Disp: , Rfl:  .  albuterol (VENTOLIN HFA) 108 (90 Base) MCG/ACT inhaler, INL 2 PFS PO Q 4 H PRN, Disp: , Rfl:  .  ALTAVERA 0.15-30 MG-MCG tablet, Take 1 tablet by mouth daily., Disp: , Rfl: 0  Review of Systems:  Constitutional: Denies fever, chills, diaphoresis, appetite change and fatigue.  HEENT: Denies photophobia, eye pain, redness, hearing loss, ear pain, congestion, sore throat, rhinorrhea, sneezing, mouth sores, trouble swallowing, neck pain, neck stiffness and tinnitus.   Respiratory: Denies SOB, DOE, cough, chest tightness,  and wheezing.   Cardiovascular: Denies chest pain, palpitations and leg swelling.  Gastrointestinal: Denies nausea, vomiting, abdominal pain, diarrhea, constipation, blood in stool and abdominal distention.  Genitourinary: Denies dysuria, urgency, frequency, hematuria, flank pain and difficulty urinating.  Endocrine: Denies: hot or cold intolerance, sweats, changes in hair or nails, polyuria, polydipsia. Musculoskeletal: Denies myalgias, back pain, joint swelling, arthralgias and gait problem.  Skin: Denies pallor, rash and wound.  Neurological: Denies dizziness, seizures, syncope, weakness, light-headedness, numbness and headaches.  Hematological: Denies adenopathy. Easy bruising, personal or family bleeding history  Psychiatric/Behavioral: Denies suicidal ideation, mood changes, confusion, nervousness, sleep disturbance and agitation    Physical Exam: Vitals:   07/17/19 0817  BP: 96/64  Pulse: 100  Temp: 97.7 F (36.5 C)  TempSrc: Temporal  SpO2: 98%  Weight: 125 lb 11.2 oz (57 kg)  Height: 5' 5"  (1.651 m)    Body mass index is 20.92 kg/m.   Constitutional: NAD, calm, comfortable Eyes: PERRL, lids and conjunctivae normal ENMT: Mucous membranes are moist.  Tympanic membrane is pearly white, no erythema or bulging. Neck: normal, supple, no masses, no thyromegaly Respiratory: clear to  auscultation bilaterally, no wheezing, no crackles. Normal respiratory effort. No accessory muscle use.  Cardiovascular: Regular rate and rhythm, no murmurs / rubs / gallops. No extremity edema. 2+ pedal pulses. No carotid bruits.  Abdomen: no tenderness, no masses palpated. No hepatosplenomegaly. Bowel sounds positive.  Musculoskeletal: no clubbing / cyanosis. No joint deformity upper and lower extremities. Good ROM, no contractures. Normal muscle tone.  Skin: no rashes, lesions, ulcers. No induration Neurologic: CN 2-12 grossly intact. Sensation intact, DTR normal. Strength 5/5 in all 4.  Psychiatric: Normal judgment and insight. Alert and oriented x 3. Normal mood.    Impression and Plan:  Encounter for preventive health examination -Has routine eye and dental care. -Will receive flu vaccination today, otherwise immunizations are up-to-date. -Healthy lifestyle has been discussed in detail. -Commence routine colon cancer screening age 89. -Commence routine breast cancer screening at age 30. -She sees GYN for cervical cancer screening in women's health.  Subclinical hypothyroidism  -Not on medication. -Check TSH, free T3, free T4 today.   Patient Instructions  -Nice seeing you today!!  -Lab work today; will notify you once results are available.   Preventive Care 84-79 Years Old, Female Preventive care refers to visits with your health care provider and lifestyle choices that can promote health and wellness. This includes:  A yearly physical exam. This may also be called an annual well check.  Regular dental visits and eye exams.  Immunizations.  Screening for certain conditions.  Healthy lifestyle choices, such as eating a healthy diet, getting regular exercise, not using drugs or products that contain nicotine and tobacco, and limiting alcohol use. What can I expect for my preventive care visit? Physical exam Your health care provider will check your:  Height and  weight. This may be used to calculate body mass index (BMI), which tells if you are at a healthy weight.  Heart rate and blood pressure.  Skin for abnormal spots. Counseling Your health care provider may ask you questions about your:  Alcohol, tobacco, and drug use.  Emotional well-being.  Home and relationship well-being.  Sexual activity.  Eating habits.  Work and work Statistician.  Method of birth control.  Menstrual cycle.  Pregnancy history. What immunizations do I need?  Influenza (flu) vaccine  This is recommended every year. Tetanus, diphtheria, and pertussis (Tdap) vaccine  You may need a Td booster every 10 years. Varicella (chickenpox) vaccine  You may need this if you have not been vaccinated. Human papillomavirus (HPV) vaccine  If recommended by your health care provider, you may need three doses over 6 months. Measles, mumps, and rubella (MMR) vaccine  You may need at least one dose of MMR. You may also need a second dose. Meningococcal conjugate (MenACWY) vaccine  One dose is recommended if you are age 67-21 years and a first-year college student living in a residence hall, or if you have one of several medical conditions. You may also need additional booster doses. Pneumococcal conjugate (PCV13) vaccine  You may need this if you have certain conditions and were not previously vaccinated. Pneumococcal polysaccharide (PPSV23) vaccine  You may need one or two  doses if you smoke cigarettes or if you have certain conditions. Hepatitis A vaccine  You may need this if you have certain conditions or if you travel or work in places where you may be exposed to hepatitis A. Hepatitis B vaccine  You may need this if you have certain conditions or if you travel or work in places where you may be exposed to hepatitis B. Haemophilus influenzae type b (Hib) vaccine  You may need this if you have certain conditions. You may receive vaccines as individual  doses or as more than one vaccine together in one shot (combination vaccines). Talk with your health care provider about the risks and benefits of combination vaccines. What tests do I need?  Blood tests  Lipid and cholesterol levels. These may be checked every 5 years starting at age 55.  Hepatitis C test.  Hepatitis B test. Screening  Diabetes screening. This is done by checking your blood sugar (glucose) after you have not eaten for a while (fasting).  Sexually transmitted disease (STD) testing.  BRCA-related cancer screening. This may be done if you have a family history of breast, ovarian, tubal, or peritoneal cancers.  Pelvic exam and Pap test. This may be done every 3 years starting at age 32. Starting at age 91, this may be done every 5 years if you have a Pap test in combination with an HPV test. Talk with your health care provider about your test results, treatment options, and if necessary, the need for more tests. Follow these instructions at home: Eating and drinking   Eat a diet that includes fresh fruits and vegetables, whole grains, lean protein, and low-fat dairy.  Take vitamin and mineral supplements as recommended by your health care provider.  Do not drink alcohol if: ? Your health care provider tells you not to drink. ? You are pregnant, may be pregnant, or are planning to become pregnant.  If you drink alcohol: ? Limit how much you have to 0-1 drink a day. ? Be aware of how much alcohol is in your drink. In the [place] one drink equals one 12 oz bottle of beer (355 mL), one 5 oz glass of wine (148 mL), or one 1 oz glass of hard liquor (44 mL). Lifestyle  Take daily care of your teeth and gums.  Stay active. Exercise for at least 30 minutes on 5 or more days each week.  Do not use any products that contain nicotine or tobacco, such as cigarettes, e-cigarettes, and chewing tobacco. If you need help quitting, ask your health care provider.  If you are  sexually active, practice safe sex. Use a condom or other form of birth control (contraception) in order to prevent pregnancy and STIs (sexually transmitted infections). If you plan to become pregnant, see your health care provider for a preconception visit. What's next?  Visit your health care provider once a year for a well check visit.  Ask your health care provider how often you should have your eyes and teeth checked.  Stay up to date on all vaccines. This information is not intended to replace advice given to you by your health care provider. Make sure you discuss any questions you have with your health care provider. Document Released: 01/02/2002 Document Revised: 07/18/2018 Document Reviewed: 07/18/2018 Elsevier Patient Education  2020 [place], [place] [facility]

## 2019-07-17 NOTE — Addendum Note (Signed)
Addended by: Westley Hummer B on: 07/17/2019 11:24 AM   Modules accepted: Orders

## 2019-07-17 NOTE — Patient Instructions (Signed)
-Nice seeing you today!!  -Lab work today; will notify you once results are available.   Preventive Care 9-24 Years Old, Female Preventive care refers to visits with your health care provider and lifestyle choices that can promote health and wellness. This includes:  A yearly physical exam. This may also be called an annual well check.  Regular dental visits and eye exams.  Immunizations.  Screening for certain conditions.  Healthy lifestyle choices, such as eating a healthy diet, getting regular exercise, not using drugs or products that contain nicotine and tobacco, and limiting alcohol use. What can I expect for my preventive care visit? Physical exam Your health care provider will check your:  Height and weight. This may be used to calculate body mass index (BMI), which tells if you are at a healthy weight.  Heart rate and blood pressure.  Skin for abnormal spots. Counseling Your health care provider may ask you questions about your:  Alcohol, tobacco, and drug use.  Emotional well-being.  Home and relationship well-being.  Sexual activity.  Eating habits.  Work and work Statistician.  Method of birth control.  Menstrual cycle.  Pregnancy history. What immunizations do I need?  Influenza (flu) vaccine  This is recommended every year. Tetanus, diphtheria, and pertussis (Tdap) vaccine  You may need a Td booster every 10 years. Varicella (chickenpox) vaccine  You may need this if you have not been vaccinated. Human papillomavirus (HPV) vaccine  If recommended by your health care provider, you may need three doses over 6 months. Measles, mumps, and rubella (MMR) vaccine  You may need at least one dose of MMR. You may also need a second dose. Meningococcal conjugate (MenACWY) vaccine  One dose is recommended if you are age 40-21 years and a first-year college student living in a residence hall, or if you have one of several medical conditions. You may  also need additional booster doses. Pneumococcal conjugate (PCV13) vaccine  You may need this if you have certain conditions and were not previously vaccinated. Pneumococcal polysaccharide (PPSV23) vaccine  You may need one or two doses if you smoke cigarettes or if you have certain conditions. Hepatitis A vaccine  You may need this if you have certain conditions or if you travel or work in places where you may be exposed to hepatitis A. Hepatitis B vaccine  You may need this if you have certain conditions or if you travel or work in places where you may be exposed to hepatitis B. Haemophilus influenzae type b (Hib) vaccine  You may need this if you have certain conditions. You may receive vaccines as individual doses or as more than one vaccine together in one shot (combination vaccines). Talk with your health care provider about the risks and benefits of combination vaccines. What tests do I need?  Blood tests  Lipid and cholesterol levels. These may be checked every 5 years starting at age 61.  Hepatitis C test.  Hepatitis B test. Screening  Diabetes screening. This is done by checking your blood sugar (glucose) after you have not eaten for a while (fasting).  Sexually transmitted disease (STD) testing.  BRCA-related cancer screening. This may be done if you have a family history of breast, ovarian, tubal, or peritoneal cancers.  Pelvic exam and Pap test. This may be done every 3 years starting at age 24. Starting at age 11, this may be done every 5 years if you have a Pap test in combination with an HPV test. Talk with your  health care provider about your test results, treatment options, and if necessary, the need for more tests. Follow these instructions at home: Eating and drinking   Eat a diet that includes fresh fruits and vegetables, whole grains, lean protein, and low-fat dairy.  Take vitamin and mineral supplements as recommended by your health care provider.   Do not drink alcohol if: ? Your health care provider tells you not to drink. ? You are pregnant, may be pregnant, or are planning to become pregnant.  If you drink alcohol: ? Limit how much you have to 0-1 drink a day. ? Be aware of how much alcohol is in your drink. In the [place] one drink equals one 12 oz bottle of beer (355 mL), one 5 oz glass of wine (148 mL), or one 1 oz glass of hard liquor (44 mL). Lifestyle  Take daily care of your teeth and gums.  Stay active. Exercise for at least 30 minutes on 5 or more days each week.  Do not use any products that contain nicotine or tobacco, such as cigarettes, e-cigarettes, and chewing tobacco. If you need help quitting, ask your health care provider.  If you are sexually active, practice safe sex. Use a condom or other form of birth control (contraception) in order to prevent pregnancy and STIs (sexually transmitted infections). If you plan to become pregnant, see your health care provider for a preconception visit. What's next?  Visit your health care provider once a year for a well check visit.  Ask your health care provider how often you should have your eyes and teeth checked.  Stay up to date on all vaccines. This information is not intended to replace advice given to you by your health care provider. Make sure you discuss any questions you have with your health care provider. Document Released: 01/02/2002 Document Revised: 07/18/2018 Document Reviewed: 07/18/2018 Elsevier Patient Education  2020 [employer].

## 2019-07-18 ENCOUNTER — Ambulatory Visit: Payer: BLUE CROSS/BLUE SHIELD | Admitting: Internal Medicine

## 2019-09-16 DIAGNOSIS — Z6820 Body mass index (BMI) 20.0-20.9, adult: Secondary | ICD-10-CM | POA: Diagnosis not present

## 2019-09-16 DIAGNOSIS — N760 Acute vaginitis: Secondary | ICD-10-CM | POA: Diagnosis not present

## 2019-09-16 DIAGNOSIS — Z01419 Encounter for gynecological examination (general) (routine) without abnormal findings: Secondary | ICD-10-CM | POA: Diagnosis not present

## 2019-11-28 ENCOUNTER — Encounter: Payer: Self-pay | Admitting: Internal Medicine

## 2019-12-01 ENCOUNTER — Telehealth: Payer: Self-pay | Admitting: *Deleted

## 2019-12-01 NOTE — Telephone Encounter (Signed)
Copied from CRM [id number]. Topic: General - Inquiry >> Nov 28, 2019  5:25 PM Leary Roca wrote: Reason for CRM: Patient is wanting to know if she can still have a an appt for Monday January 11th .please advise

## 2019-12-02 NOTE — Telephone Encounter (Signed)
Left message on machine for patient to schedule an appointment.  Okay virtual.

## 2020-02-26 ENCOUNTER — Encounter: Payer: Self-pay | Admitting: Family Medicine

## 2020-02-26 ENCOUNTER — Telehealth: Payer: Self-pay | Admitting: *Deleted

## 2020-02-26 ENCOUNTER — Ambulatory Visit (HOSPITAL_BASED_OUTPATIENT_CLINIC_OR_DEPARTMENT_OTHER)
Admission: RE | Admit: 2020-02-26 | Discharge: 2020-02-26 | Disposition: A | Discharge status: Home / Self Care | Payer: BC Managed Care – PPO | Source: Ambulatory Visit | Attending: Family Medicine | Admitting: Family Medicine

## 2020-02-26 ENCOUNTER — Other Ambulatory Visit: Payer: Self-pay

## 2020-02-26 ENCOUNTER — Ambulatory Visit: Payer: BC Managed Care – PPO | Admitting: Family Medicine

## 2020-02-26 VITALS — BP 122/70 | HR 125 | Temp 97.8°F | Resp 18 | Ht 65.0 in | Wt 128.6 lb

## 2020-02-26 DIAGNOSIS — Z8616 Personal history of COVID-19: Secondary | ICD-10-CM | POA: Insufficient documentation

## 2020-02-26 DIAGNOSIS — R002 Palpitations: Secondary | ICD-10-CM

## 2020-02-26 DIAGNOSIS — E039 Hypothyroidism, unspecified: Secondary | ICD-10-CM | POA: Diagnosis not present

## 2020-02-26 DIAGNOSIS — E038 Other specified hypothyroidism: Secondary | ICD-10-CM

## 2020-02-26 LAB — CBC WITH DIFFERENTIAL/PLATELET
Basophils Absolute: 0 10*3/uL (ref 0.0–0.1)
Basophils Relative: 0.7 % (ref 0.0–3.0)
Eosinophils Absolute: 0.3 10*3/uL (ref 0.0–0.7)
Eosinophils Relative: 8.6 % — ABNORMAL HIGH (ref 0.0–5.0)
HCT: 43.2 % (ref 36.0–46.0)
Hemoglobin: 14.3 g/dL (ref 12.0–15.0)
Lymphocytes Relative: 36.2 % (ref 12.0–46.0)
Lymphs Abs: 1.4 10*3/uL (ref 0.7–4.0)
MCHC: 33.1 g/dL (ref 30.0–36.0)
MCV: 95.4 fl (ref 78.0–100.0)
Monocytes Absolute: 0.3 10*3/uL (ref 0.1–1.0)
Monocytes Relative: 6.9 % (ref 3.0–12.0)
Neutro Abs: 1.8 10*3/uL (ref 1.4–7.7)
Neutrophils Relative %: 47.6 % (ref 43.0–77.0)
Platelets: 314 10*3/uL (ref 150.0–400.0)
RBC: 4.52 Mil/uL (ref 3.87–5.11)
RDW: 13.5 % (ref 11.5–15.5)
WBC: 3.8 10*3/uL — ABNORMAL LOW (ref 4.0–10.5)

## 2020-02-26 LAB — COMPREHENSIVE METABOLIC PANEL
ALT: 19 U/L (ref 0–35)
AST: 17 U/L (ref 0–37)
Albumin: 4.5 g/dL (ref 3.5–5.2)
Alkaline Phosphatase: 33 U/L — ABNORMAL LOW (ref 39–117)
BUN: 10 mg/dL (ref 6–23)
CO2: 27 mEq/L (ref 19–32)
Calcium: 9.5 mg/dL (ref 8.4–10.5)
Chloride: 106 mEq/L (ref 96–112)
Creatinine, Ser: 0.76 mg/dL (ref 0.40–1.20)
GFR: 92.65 mL/min (ref >60.00)
Glucose, Bld: 92 mg/dL (ref 70–99)
Potassium: 4.4 mEq/L (ref 3.5–5.1)
Sodium: 140 mEq/L (ref 135–145)
Total Bilirubin: 0.4 mg/dL (ref 0.2–1.2)
Total Protein: 6.9 g/dL (ref 6.0–8.3)

## 2020-02-26 LAB — VITAMIN B12: Vitamin B-12: 340 pg/mL (ref 211–911)

## 2020-02-26 LAB — VITAMIN D 25 HYDROXY (VIT D DEFICIENCY, FRACTURES): VITD: 28.05 ng/mL — ABNORMAL LOW (ref 30.00–100.00)

## 2020-02-26 NOTE — Telephone Encounter (Signed)
Patient enrolled for [employer] to ship a 30 day cardiac event monitor to her home.  Instructions sent to patient via My Chart message and will be included in her monitor kit as well.

## 2020-02-26 NOTE — Patient Instructions (Signed)

## 2020-02-26 NOTE — Assessment & Plan Note (Signed)
Worsening since covid  Pt denies anxiety Echo and event monitor  Check labs  rto if symptoms cont / worsen

## 2020-02-26 NOTE — Progress Notes (Signed)
Patient ID: Haley Combs, female    DOB: [birth year]  Age: 24 y.o. MRN: [id number]    Subjective:  Subjective  HPI Haley Combs presents for f/u covid---  She tested positive in February and had heart pressure and headaches + sob at that time.  Most of the symptoms went away but she is feeling palpitations and irregular heart beat    She is very concerned about after affects of covid.   Review of Systems  Constitutional: Negative for activity change, appetite change, fatigue and unexpected weight change.  Respiratory: Negative for cough and shortness of breath.   Cardiovascular: Positive for palpitations. Negative for chest pain.  Psychiatric/Behavioral: Negative for behavioral problems and dysphoric mood. The patient is not nervous/anxious.     History Past Medical History:  Diagnosis Date  . Acne   . Asthma    Weather Induced heat/cold  . GERD (gastroesophageal reflux disease)   . Scoliosis     She has a past surgical history that includes Tooth Extraction and Tympanostomy tube placement.   Her family history includes Alcohol abuse in her paternal uncle and sister; Dementia in her maternal grandmother; Drug abuse in her sister; Heart disease in her paternal grandfather; Hyperlipidemia in her father; Hypertension in her father, paternal grandfather, and paternal grandmother; Mental illness in her paternal uncle; Stroke in her paternal grandfather.She reports that she has never smoked. She has never used smokeless tobacco. She reports that she does not drink alcohol or use drugs.  Current Outpatient Medications on File Prior to Visit  Medication Sig Dispense Refill  . albuterol (VENTOLIN HFA) 108 (90 Base) MCG/ACT inhaler INL 2 PFS PO Q 4 H PRN    . ALTAVERA 0.15-30 MG-MCG tablet Take 1 tablet by mouth daily.  0  . KETOCONAZOLE, TOPICAL, 1 % SHAM Apply topically.     No current facility-administered medications on file prior to visit.     Objective:  Objective   Physical Exam Vitals and nursing note reviewed.  Constitutional:      Appearance: She is well-developed.  HENT:     Head: Normocephalic and atraumatic.  Eyes:     Conjunctiva/sclera: Conjunctivae normal.  Neck:     Thyroid: No thyromegaly.     Vascular: No carotid bruit or JVD.  Cardiovascular:     Rate and Rhythm: Normal rate and regular rhythm.     Heart sounds: Normal heart sounds. No murmur.  Pulmonary:     Effort: Pulmonary effort is normal. No respiratory distress.     Breath sounds: Normal breath sounds. No wheezing or rales.  Chest:     Chest wall: No tenderness.  Musculoskeletal:     Cervical back: Normal range of motion and neck supple.  Neurological:     Mental Status: She is alert and oriented to person, place, and time.    BP 122/70 (BP Location: Left Arm, Patient Position: Sitting, Cuff Size: Normal)   Pulse (!) 125   Temp 97.8 F (36.6 C) (Temporal)   Resp 18   Ht 5\' 5"  (1.651 m)   Wt 128 lb 9.6 oz (58.3 kg)   SpO2 99%   BMI 21.40 kg/m  Wt Readings from Last 3 Encounters:  02/26/20 128 lb 9.6 oz (58.3 kg)  07/17/19 125 lb 11.2 oz (57 kg)  01/28/19 128 lb (58.1 kg)     Lab Results  Component Value Date   WBC 5.2 08/27/2018   HGB 15.1 (H) 08/27/2018  HCT 44.5 08/27/2018   PLT 316.0 08/27/2018   GLUCOSE 98 08/27/2018   CHOL 158 07/10/2017   TRIG 115.0 07/10/2017   HDL 54.70 07/10/2017   LDLCALC 80 07/10/2017   ALT 28 08/27/2018   AST 19 08/27/2018   NA 137 08/27/2018   K 3.6 08/27/2018   CL 104 08/27/2018   CREATININE 0.80 08/27/2018   BUN 6 08/27/2018   CO2 25 08/27/2018   TSH 3.95 07/17/2019   HGBA1C 5.2 03/15/2015    US THYROID  Result Date: 12/04/2018 CLINICAL DATA:  Hypothyroid. EXAM: THYROID ULTRASOUND TECHNIQUE: Ultrasound examination of the thyroid gland and adjacent soft tissues was performed. COMPARISON:  None. FINDINGS: Parenchymal Echotexture: Normal Isthmus: 0.3 cm Right lobe: 4.7 x 1.3 x 1.3 cm Left lobe: 4.2 x 1.1 x 1.3  cm _________________________________________________________ Estimated total number of nodules >/= 1 cm: 0 Number of spongiform nodules >/=  2 cm not described below (TR1): 0 Number of mixed cystic and solid nodules >/= 1.5 cm not described below (TR2): 0 _________________________________________________________ No discrete nodules are seen within the thyroid gland. IMPRESSION: Normal thyroid gland. No discrete nodule which meets criteria for biopsy nor follow-up. The above is in keeping with the ACR TI-RADS recommendations - J Am Coll Radiol 2017;14:587-595. Electronically Signed   By: Jolaine Click M.D.   On: 12/04/2018 07:21     Assessment & Plan:  Plan  I am having Haley Combs maintain her Altavera, KETOCONAZOLE (TOPICAL), and albuterol.  No orders of the defined types were placed in this encounter.   Problem List Items Addressed This Visit      Unprioritized   History of COVID-19   Relevant Orders   DG Chest 2 View   Cardiac event monitor   Palpitations - Primary    Worsening since covid  Pt denies anxiety Echo and event monitor  Check labs  rto if symptoms cont / worsen      Relevant Orders   EKG 12-Lead (Completed)   Thyroid Panel With TSH   CBC with Differential/Platelet   Comprehensive metabolic panel   Vitamin B12   Vitamin D (25 hydroxy)   DG Chest 2 View   ECHOCARDIOGRAM COMPLETE   Cardiac event monitor   Subclinical hypothyroidism    Check labs          Follow-up: Return in about 6 months (around 08/27/2020), or if symptoms worsen or fail to improve, for annual exam, fasting.  Haley Schultz, DO

## 2020-02-26 NOTE — Assessment & Plan Note (Signed)
Check labs 

## 2020-02-27 LAB — THYROID PANEL WITH TSH
Free Thyroxine Index: 2.1 (ref 1.4–3.8)
T3 Uptake: 28 % (ref 22–35)
T4, Total: 7.5 ug/dL (ref 5.1–11.9)
TSH: 2.19 mIU/L

## 2020-02-29 ENCOUNTER — Other Ambulatory Visit: Payer: Self-pay | Admitting: Family Medicine

## 2020-02-29 DIAGNOSIS — E559 Vitamin D deficiency, unspecified: Secondary | ICD-10-CM

## 2020-02-29 MED ORDER — VITAMIN D (ERGOCALCIFEROL) 1.25 MG (50000 UNIT) PO CAPS: 50000.0000 [IU] | ORAL_CAPSULE | ORAL | 1 refills | Status: DC

## 2020-03-22 ENCOUNTER — Encounter (INDEPENDENT_AMBULATORY_CARE_PROVIDER_SITE_OTHER): Payer: BC Managed Care – PPO

## 2020-03-22 DIAGNOSIS — Z8616 Personal history of COVID-19: Secondary | ICD-10-CM

## 2020-03-22 DIAGNOSIS — R002 Palpitations: Secondary | ICD-10-CM

## 2020-03-31 ENCOUNTER — Telehealth (HOSPITAL_COMMUNITY): Payer: Self-pay | Admitting: Family Medicine

## 2020-03-31 NOTE — Telephone Encounter (Signed)
Patient called and cancelled Echocardiogram in our office and states she is feeling much better and will wait on monitor results. Order will be removed from [facility] and if pt decides to have at a later date we can reinstate order or enter new one.

## 2020-04-02 ENCOUNTER — Ambulatory Visit: Payer: BC Managed Care – PPO | Admitting: Family Medicine

## 2020-04-08 ENCOUNTER — Other Ambulatory Visit (HOSPITAL_COMMUNITY): Payer: BC Managed Care – PPO

## 2020-07-16 ENCOUNTER — Encounter: Payer: Self-pay | Admitting: Family Medicine

## 2020-07-19 NOTE — Telephone Encounter (Signed)
cxr would not show the spleen-- she would need to be seen and then we can order what is necessary

## 2020-07-20 ENCOUNTER — Encounter: Payer: BC Managed Care – PPO | Admitting: Internal Medicine

## 2020-07-22 ENCOUNTER — Encounter: Payer: BC Managed Care – PPO | Admitting: Internal Medicine

## 2020-07-30 ENCOUNTER — Encounter: Payer: BC Managed Care – PPO | Admitting: Family Medicine

## 2020-08-10 ENCOUNTER — Other Ambulatory Visit: Payer: Self-pay | Admitting: Family Medicine

## 2020-08-10 DIAGNOSIS — E559 Vitamin D deficiency, unspecified: Secondary | ICD-10-CM

## 2020-08-17 ENCOUNTER — Encounter: Payer: BC Managed Care – PPO | Admitting: Family Medicine

## 2020-08-27 ENCOUNTER — Encounter: Payer: BC Managed Care – PPO | Admitting: Family Medicine

## 2020-10-08 ENCOUNTER — Ambulatory Visit (INDEPENDENT_AMBULATORY_CARE_PROVIDER_SITE_OTHER): Payer: Commercial Managed Care - PPO | Admitting: Family Medicine

## 2020-10-08 ENCOUNTER — Other Ambulatory Visit: Payer: Self-pay

## 2020-10-08 ENCOUNTER — Encounter: Payer: Self-pay | Admitting: Family Medicine

## 2020-10-08 VITALS — BP 118/70 | HR 128 | Temp 98.1°F | Resp 18 | Ht 65.0 in | Wt 128.2 lb

## 2020-10-08 DIAGNOSIS — E559 Vitamin D deficiency, unspecified: Secondary | ICD-10-CM

## 2020-10-08 DIAGNOSIS — Z23 Encounter for immunization: Secondary | ICD-10-CM | POA: Diagnosis not present

## 2020-10-08 DIAGNOSIS — Z0000 Encounter for general adult medical examination without abnormal findings: Secondary | ICD-10-CM | POA: Diagnosis not present

## 2020-10-08 NOTE — Patient Instructions (Signed)
Preventive Care 21-25 Years Old, Female Preventive care refers to visits with your health care provider and lifestyle choices that can promote health and wellness. This includes:  A yearly physical exam. This may also be called an annual well check.  Regular dental visits and eye exams.  Immunizations.  Screening for certain conditions.  Healthy lifestyle choices, such as eating a healthy diet, getting regular exercise, not using drugs or products that contain nicotine and tobacco, and limiting alcohol use. What can I expect for my preventive care visit? Physical exam Your health care provider will check your:  Height and weight. This may be used to calculate body mass index (BMI), which tells if you are at a healthy weight.  Heart rate and blood pressure.  Skin for abnormal spots. Counseling Your health care provider may ask you questions about your:  Alcohol, tobacco, and drug use.  Emotional well-being.  Home and relationship well-being.  Sexual activity.  Eating habits.  Work and work environment.  Method of birth control.  Menstrual cycle.  Pregnancy history. What immunizations do I need?  Influenza (flu) vaccine  This is recommended every year. Tetanus, diphtheria, and pertussis (Tdap) vaccine  You may need a Td booster every 10 years. Varicella (chickenpox) vaccine  You may need this if you have not been vaccinated. Human papillomavirus (HPV) vaccine  If recommended by your health care provider, you may need three doses over 6 months. Measles, mumps, and rubella (MMR) vaccine  You may need at least one dose of MMR. You may also need a second dose. Meningococcal conjugate (MenACWY) vaccine  One dose is recommended if you are age 19-21 years and a first-year college student living in a residence hall, or if you have one of several medical conditions. You may also need additional booster doses. Pneumococcal conjugate (PCV13) vaccine  You may need  this if you have certain conditions and were not previously vaccinated. Pneumococcal polysaccharide (PPSV23) vaccine  You may need one or two doses if you smoke cigarettes or if you have certain conditions. Hepatitis A vaccine  You may need this if you have certain conditions or if you travel or work in places where you may be exposed to hepatitis A. Hepatitis B vaccine  You may need this if you have certain conditions or if you travel or work in places where you may be exposed to hepatitis B. Haemophilus influenzae type b (Hib) vaccine  You may need this if you have certain conditions. You may receive vaccines as individual doses or as more than one vaccine together in one shot (combination vaccines). Talk with your health care provider about the risks and benefits of combination vaccines. What tests do I need?  Blood tests  Lipid and cholesterol levels. These may be checked every 5 years starting at age 20.  Hepatitis C test.  Hepatitis B test. Screening  Diabetes screening. This is done by checking your blood sugar (glucose) after you have not eaten for a while (fasting).  Sexually transmitted disease (STD) testing.  BRCA-related cancer screening. This may be done if you have a family history of breast, ovarian, tubal, or peritoneal cancers.  Pelvic exam and Pap test. This may be done every 3 years starting at age 21. Starting at age 30, this may be done every 5 years if you have a Pap test in combination with an HPV test. Talk with your health care provider about your test results, treatment options, and if necessary, the need for more tests.   Follow these instructions at home: Eating and drinking   Eat a diet that includes fresh fruits and vegetables, whole grains, lean protein, and low-fat dairy.  Take vitamin and mineral supplements as recommended by your health care provider.  Do not drink alcohol if: ? Your health care provider tells you not to drink. ? You are  pregnant, may be pregnant, or are planning to become pregnant.  If you drink alcohol: ? Limit how much you have to 0-1 drink a day. ? Be aware of how much alcohol is in your drink. In the [place] one drink equals one 12 oz bottle of beer (355 mL), one 5 oz glass of wine (148 mL), or one 1 oz glass of hard liquor (44 mL). Lifestyle  Take daily care of your teeth and gums.  Stay active. Exercise for at least 30 minutes on 5 or more days each week.  Do not use any products that contain nicotine or tobacco, such as cigarettes, e-cigarettes, and chewing tobacco. If you need help quitting, ask your health care provider.  If you are sexually active, practice safe sex. Use a condom or other form of birth control (contraception) in order to prevent pregnancy and STIs (sexually transmitted infections). If you plan to become pregnant, see your health care provider for a preconception visit. What's next?  Visit your health care provider once a year for a well check visit.  Ask your health care provider how often you should have your eyes and teeth checked.  Stay up to date on all vaccines. This information is not intended to replace advice given to you by your health care provider. Make sure you discuss any questions you have with your health care provider. Document Revised: 07/18/2018 Document Reviewed: 07/18/2018 Elsevier Patient Education  2020 [employer].

## 2020-10-08 NOTE — Progress Notes (Signed)
Subjective:     Haley Combs is a 24 y.o. female and is here for a comprehensive physical exam. The patient reports no problems.  Social History   Socioeconomic History  . Marital status: Single    Spouse name: Not on file  . Number of children: 0  . Years of education: Not on file  . Highest education level: Not on file  Occupational History  . Not on file  Tobacco Use  . Smoking status: Never Smoker  . Smokeless tobacco: Never Used  Substance and Sexual Activity  . Alcohol use: No  . Drug use: No  . Sexual activity: Not Currently    Partners: Male    Birth control/protection: Condom, Pill  Other Topics Concern  . Not on file  Social History Narrative   Exercise-- y 3days a week   Rare caffeine    Social Determinants of Health   Financial Resource Strain:   . Difficulty of Paying Living Expenses: Not on file  Food Insecurity:   . Worried About Programme researcher, broadcasting/film/video in the Last Year: Not on file  . Ran Out of Food in the Last Year: Not on file  Transportation Needs:   . Lack of Transportation (Medical): Not on file  . Lack of Transportation (Non-Medical): Not on file  Physical Activity:   . Days of Exercise per Week: Not on file  . Minutes of Exercise per Session: Not on file  Stress:   . Feeling of Stress : Not on file  Social Connections:   . Frequency of Communication with Friends and Family: Not on file  . Frequency of Social Gatherings with Friends and Family: Not on file  . Attends Religious Services: Not on file  . Active Member of Clubs or Organizations: Not on file  . Attends Banker Meetings: Not on file  . Marital Status: Not on file  Intimate Partner Violence:   . Fear of Current or Ex-Partner: Not on file  . Emotionally Abused: Not on file  . Physically Abused: Not on file  . Sexually Abused: Not on file   Health Maintenance  Topic Date Due  . PAP-Cervical Cytology Screening  Never done  . INFLUENZA VACCINE  06/20/2020  . COVID-19  Vaccine (2 - Pfizer 2-dose series) 07/17/2020  . PAP SMEAR-Modifier  10/06/2023  . TETANUS/TDAP  07/04/2028  . Hepatitis C Screening  Completed  . HIV Screening  Completed    The following portions of the patient's history were reviewed and updated as appropriate:  She  has a past medical history of Acne, Asthma, GERD (gastroesophageal reflux disease), and Scoliosis. She does not have any pertinent problems on file. She  has a past surgical history that includes Tooth Extraction and Tympanostomy tube placement. Her family history includes Alcohol abuse in her paternal uncle and sister; Dementia in her maternal grandmother; Drug abuse in her sister; Heart disease in her paternal grandfather; Hyperlipidemia in her father; Hypertension in her father, paternal grandfather, and paternal grandmother; Mental illness in her paternal uncle; Stroke in her paternal grandfather. She  reports that she has never smoked. She has never used smokeless tobacco. She reports that she does not drink alcohol and does not use drugs. She has a current medication list which includes the following prescription(s): albuterol, altavera, ketoconazole (topical), and vitamin d (ergocalciferol). Current Outpatient Medications on File Prior to Visit  Medication Sig Dispense Refill  . albuterol (VENTOLIN HFA) 108 (90 Base) MCG/ACT inhaler INL 2 PFS PO  Q 4 H PRN    . ALTAVERA 0.15-30 MG-MCG tablet Take 1 tablet by mouth daily.  0  . KETOCONAZOLE, TOPICAL, 1 % SHAM Apply topically.    . Vitamin D, Ergocalciferol, (DRISDOL) 1.25 MG (50000 UNIT) CAPS capsule TAKE 1 CAPSULE (50,000 UNITS TOTAL) BY MOUTH EVERY 7 (SEVEN) DAYS. 12 capsule 1   No current facility-administered medications on file prior to visit.   She has No Known Allergies..  Review of Systems Review of Systems  Constitutional: Negative for activity change, appetite change and fatigue.  HENT: Negative for hearing loss, congestion, tinnitus and ear discharge.   dentist q45m Eyes: Negative for visual disturbance (see optho q1y -- vision corrected to 20/20 with glasses).  Respiratory: Negative for cough, chest tightness and shortness of breath.   Cardiovascular: Negative for chest pain, palpitations and leg swelling.  Gastrointestinal: Negative for abdominal pain, diarrhea, constipation and abdominal distention.  Genitourinary: Negative for urgency, frequency, decreased urine volume and difficulty urinating.  Musculoskeletal: Negative for back pain, arthralgias and gait problem.  Skin: Negative for color change, pallor and rash.  Neurological: Negative for dizziness, light-headedness, numbness and headaches.  Hematological: Negative for adenopathy. Does not bruise/bleed easily.  Psychiatric/Behavioral: Negative for suicidal ideas, confusion, sleep disturbance, self-injury, dysphoric mood, decreased concentration and agitation.       Objective:    BP 118/70 (BP Location: Right Arm, Patient Position: Sitting, Cuff Size: Normal)   Pulse (!) 128   Temp 98.1 F (36.7 C) (Oral)   Resp 18   Ht 5\' 5"  (1.651 m)   Wt 128 lb 3.2 oz (58.2 kg)   LMP 10/07/2020   SpO2 100%   BMI 21.33 kg/m  General appearance: alert, cooperative, appears stated age and no distress Head: Normocephalic, without obvious abnormality, atraumatic Eyes: conjunctivae/corneas clear. PERRL, EOM's intact. Fundi benign. Ears: normal TM's and external ear canals both ears Neck: no adenopathy, no carotid bruit, no JVD, supple, symmetrical, trachea midline and thyroid not enlarged, symmetric, no tenderness/mass/nodules Back: scoliosis Lungs: clear to auscultation bilaterally Breasts: gyn Heart: regular rate and rhythm, S1, S2 normal, no murmur, click, rub or gallop Abdomen: soft, non-tender; bowel sounds normal; no masses,  no organomegaly Pelvic: deferred Extremities: extremities normal, atraumatic, no cyanosis or edema Pulses: 2+ and symmetric Skin: Skin color, texture, turgor  normal. No rashes or lesions Lymph nodes: Cervical, supraclavicular, and axillary nodes normal. Neurologic: Alert and oriented X 3, normal strength and tone. Normal symmetric reflexes. Normal coordination and gait    Assessment:    Healthy female exam.      Plan:     ghm utd Check labs  See After Visit Summary for Counseling Recommendations    1. Preventative health care See above  - Lipid panel; Future - CBC with Differential/Platelet; Future - TSH; Future - Comprehensive metabolic panel; Future - Vitamin D (25 hydroxy); Future  2. Vitamin D deficiency Check labs  - Vitamin D (25 hydroxy); Future  3. Need for influenza vaccination

## 2020-10-11 ENCOUNTER — Other Ambulatory Visit (INDEPENDENT_AMBULATORY_CARE_PROVIDER_SITE_OTHER): Payer: Commercial Managed Care - PPO

## 2020-10-11 DIAGNOSIS — Z0000 Encounter for general adult medical examination without abnormal findings: Secondary | ICD-10-CM | POA: Diagnosis not present

## 2020-10-11 DIAGNOSIS — E559 Vitamin D deficiency, unspecified: Secondary | ICD-10-CM | POA: Diagnosis not present

## 2020-10-11 LAB — CBC WITH DIFFERENTIAL/PLATELET
Basophils Absolute: 0 10*3/uL (ref 0.0–0.1)
Basophils Relative: 0.3 % (ref 0.0–3.0)
Eosinophils Absolute: 0.1 10*3/uL (ref 0.0–0.7)
Eosinophils Relative: 2 % (ref 0.0–5.0)
HCT: 42.2 % (ref 36.0–46.0)
Hemoglobin: 14.5 g/dL (ref 12.0–15.0)
Lymphocytes Relative: 32.5 % (ref 12.0–46.0)
Lymphs Abs: 1.9 10*3/uL (ref 0.7–4.0)
MCHC: 34.3 g/dL (ref 30.0–36.0)
MCV: 91.9 fl (ref 78.0–100.0)
Monocytes Absolute: 0.4 10*3/uL (ref 0.1–1.0)
Monocytes Relative: 7.3 % (ref 3.0–12.0)
Neutro Abs: 3.4 10*3/uL (ref 1.4–7.7)
Neutrophils Relative %: 57.9 % (ref 43.0–77.0)
Platelets: 307 10*3/uL (ref 150.0–400.0)
RBC: 4.6 Mil/uL (ref 3.87–5.11)
RDW: 12.7 % (ref 11.5–15.5)
WBC: 5.9 10*3/uL (ref 4.0–10.5)

## 2020-10-11 LAB — TSH: TSH: 2.52 u[IU]/mL (ref 0.35–4.50)

## 2020-10-11 LAB — COMPREHENSIVE METABOLIC PANEL
ALT: 17 U/L (ref 0–35)
AST: 17 U/L (ref 0–37)
Albumin: 4.3 g/dL (ref 3.5–5.2)
Alkaline Phosphatase: 33 U/L — ABNORMAL LOW (ref 39–117)
BUN: 11 mg/dL (ref 6–23)
CO2: 25 mEq/L (ref 19–32)
Calcium: 9 mg/dL (ref 8.4–10.5)
Chloride: 105 mEq/L (ref 96–112)
Creatinine, Ser: 0.87 mg/dL (ref 0.40–1.20)
GFR: 92.4 mL/min (ref >60.00)
Glucose, Bld: 95 mg/dL (ref 70–99)
Potassium: 3.5 mEq/L (ref 3.5–5.1)
Sodium: 139 mEq/L (ref 135–145)
Total Bilirubin: 0.6 mg/dL (ref 0.2–1.2)
Total Protein: 7 g/dL (ref 6.0–8.3)

## 2020-10-11 LAB — LIPID PANEL
Cholesterol: 173 mg/dL (ref 0–200)
HDL: 55.9 mg/dL (ref >39.00)
LDL Cholesterol: 104 mg/dL — ABNORMAL HIGH (ref 0–99)
NonHDL: 116.65
Total CHOL/HDL Ratio: 3
Triglycerides: 61 mg/dL (ref 0.0–149.0)
VLDL: 12.2 mg/dL (ref 0.0–40.0)

## 2020-10-11 LAB — VITAMIN D 25 HYDROXY (VIT D DEFICIENCY, FRACTURES): VITD: 31.71 ng/mL (ref 30.00–100.00)

## 2021-10-10 ENCOUNTER — Encounter: Payer: Commercial Managed Care - PPO | Admitting: Family Medicine

## 2021-10-10 ENCOUNTER — Other Ambulatory Visit: Payer: Self-pay

## 2021-10-11 ENCOUNTER — Encounter: Payer: Self-pay | Admitting: Family Medicine

## 2021-10-11 ENCOUNTER — Ambulatory Visit (INDEPENDENT_AMBULATORY_CARE_PROVIDER_SITE_OTHER): Payer: 59 | Admitting: Family Medicine

## 2021-10-11 VITALS — BP 128/80 | HR 117 | Temp 98.3°F | Resp 16 | Ht 65.0 in | Wt 122.6 lb

## 2021-10-11 DIAGNOSIS — Z0000 Encounter for general adult medical examination without abnormal findings: Secondary | ICD-10-CM

## 2021-10-11 DIAGNOSIS — J4520 Mild intermittent asthma, uncomplicated: Secondary | ICD-10-CM | POA: Diagnosis not present

## 2021-10-11 DIAGNOSIS — M25559 Pain in unspecified hip: Secondary | ICD-10-CM

## 2021-10-11 MED ORDER — ALBUTEROL SULFATE HFA 108 (90 BASE) MCG/ACT IN AERS: INHALATION_SPRAY | RESPIRATORY_TRACT | 2 refills | Status: AC

## 2021-10-11 NOTE — Progress Notes (Signed)
Subjective:   By signing my name below, I, Shehryar Baig, attest that this documentation has been prepared under the direction and in the presence of Dr. Seabron Spates, DO. 10/11/2021      Patient ID: Haley Combs, female    DOB: May 18, 1996, 25 y.o.   MRN: [id number]  Chief Complaint  Patient presents with   Annual Exam    Pt states not fasting     HPI Patient is in today for a a comprehensive physical exam.  Hip pain- she complains of hip pain for the past 3 weeks. She notes that her father has a history of hip replacement procedures and notes that the same hip is hurting. Her father informed her her and his hips are lower than normal. She is interested in seeing a specialist for further evaluation Asthma- Her symptoms present once every month. She noticed they would present after the end of a long bike ride. She has an inhaler but thinks they may have expired. She is requesting a refill for a new one.  GYN- She is currently seeing a GYN specialist regularly. She completed her pap smear last week with them.  Mole- She reports having a mole on her back removed and biopsied. She notes that the site of the is starting to develop freckles again. She is planning to discuss this further with er dermatologist.   CPE She denies having any fever, new moles, congestion, sore throat, new muscle pain, new joint pain, chest pain, cough, SOB, wheezing, n/v/d, constipation, blood in stool, dysuria, frequency, hematuria, or headaches at this time. Social history- She reports her father has a clogged artery, otherwise she has no recent changes to her family medical history. She occasionally drinks alcohol. She does not use drugs. She is not sexually active at this time. She does not smoke tobacco or vaping products.  Immunizations- She is interested in receiving a flu vaccine at a later date. She has 3 Covid-19 vaccines. She was informed of the bivalent Covid-19 vaccine.  Lab work- She reports last  eating 4.5 hours ago and is willing to complete her lab work during this visit.  Pap Smear- She reports completing it last week.    Past Medical History:  Diagnosis Date   Acne    Asthma    Weather Induced heat/cold   GERD (gastroesophageal reflux disease)    Scoliosis     Past Surgical History:  Procedure Laterality Date   TOOTH EXTRACTION     TYMPANOSTOMY TUBE PLACEMENT      Family History  Problem Relation Age of Onset   Hypertension Father        Not Medicated   Hyperlipidemia Father    Heart disease Father    Alcohol abuse Sister    Drug abuse Sister    Dementia Maternal Grandmother    Hypertension Paternal Grandmother    Hypertension Paternal Grandfather    Stroke Paternal Grandfather    Heart disease Paternal Grandfather        Pacemaker, Ruptured Aorta   Alcohol abuse Paternal Uncle    Mental illness Paternal Uncle        Schizophrania   Colon cancer Neg Hx    Colon polyps Neg Hx     Social History   Socioeconomic History   Marital status: Single    Spouse name: Not on file   Number of children: 0   Years of education: Not on file   Highest education level: Not on file  Occupational  History   Occupation: [employer]  Tobacco Use   Smoking status: Never   Smokeless tobacco: Never  Substance and Sexual Activity   Alcohol use: Yes    Comment: occassional   Drug use: No   Sexual activity: Not Currently    Partners: Male    Birth control/protection: Condom, Pill  Other Topics Concern   Not on file  Social History Narrative   Exercise-- y 3days a week   Rare caffeine    Social Determinants of Health   Financial Resource Strain: Not on file  Food Insecurity: Not on file  Transportation Needs: Not on file  Physical Activity: Not on file  Stress: Not on file  Social Connections: Not on file  Intimate Partner Violence: Not on file    Outpatient Medications Prior to Visit  Medication Sig Dispense Refill   ALTAVERA 0.15-30 MG-MCG tablet  Take 1 tablet by mouth daily.  0   KETOCONAZOLE, TOPICAL, 1 % SHAM Apply topically.     Vitamin D, Ergocalciferol, (DRISDOL) 1.25 MG (50000 UNIT) CAPS capsule TAKE 1 CAPSULE (50,000 UNITS TOTAL) BY MOUTH EVERY 7 (SEVEN) DAYS. 12 capsule 1   albuterol (VENTOLIN HFA) 108 (90 Base) MCG/ACT inhaler INL 2 PFS PO Q 4 H PRN     No facility-administered medications prior to visit.    No Known Allergies  Review of Systems  Constitutional:  Negative for fever.  HENT:  Negative for congestion and sore throat.   Respiratory:  Negative for cough, shortness of breath and wheezing.   Cardiovascular:  Negative for chest pain.  Gastrointestinal:  Negative for blood in stool, constipation, diarrhea, nausea and vomiting.  Genitourinary:  Negative for dysuria, frequency and hematuria.  Musculoskeletal:  Negative for joint pain and myalgias.       (+)hip pain  Skin:        (-)New moles  Neurological:  Negative for headaches.      Objective:    Physical Exam Constitutional:      General: She is not in acute distress.    Appearance: Normal appearance. She is not ill-appearing.  HENT:     Head: Normocephalic and atraumatic.     Right Ear: Tympanic membrane, ear canal and external ear normal.     Left Ear: Tympanic membrane, ear canal and external ear normal.  Eyes:     Extraocular Movements: Extraocular movements intact.     Pupils: Pupils are equal, round, and reactive to light.  Cardiovascular:     Rate and Rhythm: Normal rate and regular rhythm.     Heart sounds: Normal heart sounds. No murmur heard.   No gallop.  Pulmonary:     Effort: Pulmonary effort is normal. No respiratory distress.     Breath sounds: Normal breath sounds. No wheezing or rales.  Abdominal:     General: Bowel sounds are normal. There is no distension.     Palpations: Abdomen is soft.     Tenderness: There is no abdominal tenderness. There is no guarding.  Skin:    General: Skin is warm and dry.  Neurological:      Mental Status: She is alert and oriented to person, place, and time.  Psychiatric:        Behavior: Behavior normal.        Judgment: Judgment normal.    BP 128/80 (BP Location: Left Arm, Patient Position: Sitting, Cuff Size: Normal)   Pulse (!) 117   Temp 98.3 F (36.8 C) (Oral)   Resp  16   Ht 5\' 5"  (1.651 m)   Wt 122 lb 9.6 oz (55.6 kg)   LMP 09/13/2021   SpO2 99%   BMI 20.40 kg/m  Wt Readings from Last 3 Encounters:  10/11/21 122 lb 9.6 oz (55.6 kg)  10/08/20 128 lb 3.2 oz (58.2 kg)  02/26/20 128 lb 9.6 oz (58.3 kg)    Diabetic Foot Exam - Simple   No data filed    Lab Results  Component Value Date   WBC 4.7 10/11/2021   HGB 14.2 10/11/2021   HCT 42.3 10/11/2021   PLT 301.0 10/11/2021   GLUCOSE 93 10/11/2021   CHOL 217 (H) 10/11/2021   TRIG 53.0 10/11/2021   HDL 64.90 10/11/2021   LDLCALC 141 (H) 10/11/2021   ALT 17 10/11/2021   AST 21 10/11/2021   NA 139 10/11/2021   K 4.1 10/11/2021   CL 105 10/11/2021   CREATININE 0.73 10/11/2021   BUN 9 10/11/2021   CO2 25 10/11/2021   TSH 3.20 10/11/2021   HGBA1C 5.2 03/15/2015    Lab Results  Component Value Date   TSH 3.20 10/11/2021   Lab Results  Component Value Date   WBC 4.7 10/11/2021   HGB 14.2 10/11/2021   HCT 42.3 10/11/2021   MCV 94.9 10/11/2021   PLT 301.0 10/11/2021   Lab Results  Component Value Date   NA 139 10/11/2021   K 4.1 10/11/2021   CO2 25 10/11/2021   GLUCOSE 93 10/11/2021   BUN 9 10/11/2021   CREATININE 0.73 10/11/2021   BILITOT 0.5 10/11/2021   ALKPHOS 35 (L) 10/11/2021   AST 21 10/11/2021   ALT 17 10/11/2021   PROT 7.5 10/11/2021   ALBUMIN 5.0 10/11/2021   CALCIUM 9.6 10/11/2021   ANIONGAP 8 03/12/2015   GFR 113.26 10/11/2021   Lab Results  Component Value Date   CHOL 217 (H) 10/11/2021   Lab Results  Component Value Date   HDL 64.90 10/11/2021   Lab Results  Component Value Date   LDLCALC 141 (H) 10/11/2021   Lab Results  Component Value Date   TRIG 53.0  10/11/2021   Lab Results  Component Value Date   CHOLHDL 3 10/11/2021   Lab Results  Component Value Date   HGBA1C 5.2 03/15/2015       Assessment & Plan:   Problem List Items Addressed This Visit       Unprioritized   Asthma, chronic    Refill albuterol  stable      Relevant Medications   albuterol (VENTOLIN HFA) 108 (90 Base) MCG/ACT inhaler   Preventative health care - Primary    ghm utd Check labs  See avs      Relevant Orders   TSH (Completed)   CBC with Differential/Platelet (Completed)   Comprehensive metabolic panel (Completed)   Lipid panel (Completed)   Other Visit Diagnoses     Hip pain       Relevant Orders   Ambulatory referral to Sports Medicine        Meds ordered this encounter  Medications   albuterol (VENTOLIN HFA) 108 (90 Base) MCG/ACT inhaler    Sig: INL 2 PFS PO Q 4 H PRN    Dispense:  1 each    Refill:  2    I, Dr. Roma Schanz, DO, personally preformed the services described in this documentation.  All medical record entries made by the scribe were at my direction and in my presence.  I have reviewed  the chart and discharge instructions (if applicable) and agree that the record reflects my personal performance and is accurate and complete. 10/11/2021   I,Shehryar Baig,acting as a scribe for Ann Held, DO.,have documented all relevant documentation on the behalf of Ann Held, DO,as directed by  Ann Held, DO while in the presence of Ann Held, DO.   Ann Held, DO

## 2021-10-11 NOTE — Patient Instructions (Signed)

## 2021-10-12 LAB — LIPID PANEL
Cholesterol: 217 mg/dL — ABNORMAL HIGH (ref 0–200)
HDL: 64.9 mg/dL (ref >39.00)
LDL Cholesterol: 141 mg/dL — ABNORMAL HIGH (ref 0–99)
NonHDL: 151.63
Total CHOL/HDL Ratio: 3
Triglycerides: 53 mg/dL (ref 0.0–149.0)
VLDL: 10.6 mg/dL (ref 0.0–40.0)

## 2021-10-12 LAB — COMPREHENSIVE METABOLIC PANEL
ALT: 17 U/L (ref 0–35)
AST: 21 U/L (ref 0–37)
Albumin: 5 g/dL (ref 3.5–5.2)
Alkaline Phosphatase: 35 U/L — ABNORMAL LOW (ref 39–117)
BUN: 9 mg/dL (ref 6–23)
CO2: 25 mEq/L (ref 19–32)
Calcium: 9.6 mg/dL (ref 8.4–10.5)
Chloride: 105 mEq/L (ref 96–112)
Creatinine, Ser: 0.73 mg/dL (ref 0.40–1.20)
GFR: 113.26 mL/min (ref >60.00)
Glucose, Bld: 93 mg/dL (ref 70–99)
Potassium: 4.1 mEq/L (ref 3.5–5.1)
Sodium: 139 mEq/L (ref 135–145)
Total Bilirubin: 0.5 mg/dL (ref 0.2–1.2)
Total Protein: 7.5 g/dL (ref 6.0–8.3)

## 2021-10-12 LAB — CBC WITH DIFFERENTIAL/PLATELET
Basophils Absolute: 0 10*3/uL (ref 0.0–0.1)
Basophils Relative: 0.5 % (ref 0.0–3.0)
Eosinophils Absolute: 0.1 10*3/uL (ref 0.0–0.7)
Eosinophils Relative: 1.5 % (ref 0.0–5.0)
HCT: 42.3 % (ref 36.0–46.0)
Hemoglobin: 14.2 g/dL (ref 12.0–15.0)
Lymphocytes Relative: 35.7 % (ref 12.0–46.0)
Lymphs Abs: 1.7 10*3/uL (ref 0.7–4.0)
MCHC: 33.5 g/dL (ref 30.0–36.0)
MCV: 94.9 fl (ref 78.0–100.0)
Monocytes Absolute: 0.3 10*3/uL (ref 0.1–1.0)
Monocytes Relative: 6.9 % (ref 3.0–12.0)
Neutro Abs: 2.6 10*3/uL (ref 1.4–7.7)
Neutrophils Relative %: 55.4 % (ref 43.0–77.0)
Platelets: 301 10*3/uL (ref 150.0–400.0)
RBC: 4.45 Mil/uL (ref 3.87–5.11)
RDW: 12.9 % (ref 11.5–15.5)
WBC: 4.7 10*3/uL (ref 4.0–10.5)

## 2021-10-12 LAB — TSH: TSH: 3.2 u[IU]/mL (ref 0.35–5.50)

## 2021-10-18 DIAGNOSIS — Z0000 Encounter for general adult medical examination without abnormal findings: Secondary | ICD-10-CM | POA: Insufficient documentation

## 2021-10-18 NOTE — Assessment & Plan Note (Signed)
Refill albuterol stable 

## 2021-10-18 NOTE — Assessment & Plan Note (Signed)
ghm utd Check labs  See avs  

## 2022-02-02 NOTE — Progress Notes (Signed)
? ?   Aleen Sells D.Judd Gaudier ?[facility] ?[address] [place] [address] ?Phone: [phone] ?  ?Assessment and Plan:   ?  ?1. Snapping hip syndrome, right ?2. Scoliosis of lumbar spine, unspecified scoliosis type ?-Chronic with exacerbation, initial sports medicine visit ?- History of lumbar scoliosis that is likely led to pelvic dysfunction and snapping hip syndrome of right hip due to iliopsoas tendon based on HPI, physical exam, imaging ?- Recommend restarting workout routine which is helped in the past as well as HEP for hips and hip flexor stretching ?- X-ray obtained in clinic.  My interpretation: No acute fracture or dislocation.  Normal-appearing femoral acetabular joints.  Inferior portion of patient's lumbar scoliosis seen at L4-L5 ?  ?Pertinent previous records reviewed include chest x-ray/8/21 looking at patient's lumbar scoliosis, PCP note 10/11/2021, PCP note 10/08/2020 ?  ?Follow Up: As needed if no improvement or worsening of symptoms ?  ?Subjective:   ?I, Jerene Canny, am serving as a Neurosurgeon for [facility] [employer] ? ?Chief Complaint: hip pain  ? ?HPI:  ?02/03/2022 ?Patient is a 26 year old female complaining of hip pain. Patient states that she noticed in November if she does a butterfly pose her right side will not her let her , she says it doesn't feel like a muscle thing, acute onset while she was sitting in the butterfly position has noticed some clicking, father has the same issue a bone spur had a hip replacement, hips are uneven  the right side is the lower one, no numbness tingling, no pain cant get all the way down does note she has scoliosis  ? ? ?Relevant Historical Information: Father with early onset femoral acetabular osteoarthritis requiring hip replacement in his 78s ? ?Additional pertinent review of systems negative. ? ? ?Current Outpatient Medications:  ?  albuterol (VENTOLIN HFA) 108 (90 Base) MCG/ACT inhaler, INL 2 PFS PO Q 4 H PRN, Disp:  1 each, Rfl: 2 ?  ALTAVERA 0.15-30 MG-MCG tablet, Take 1 tablet by mouth daily., Disp: , Rfl: 0 ?  KETOCONAZOLE, TOPICAL, 1 % SHAM, Apply topically., Disp: , Rfl:  ?  Vitamin D, Ergocalciferol, (DRISDOL) 1.25 MG (50000 UNIT) CAPS capsule, TAKE 1 CAPSULE (50,000 UNITS TOTAL) BY MOUTH EVERY 7 (SEVEN) DAYS., Disp: 12 capsule, Rfl: 1  ? ?Objective:   ?  ?Vitals:  ? 02/03/22 1309  ?BP: 120/80  ?Pulse: (!) 114  ?SpO2: 99%  ?Weight: 122 lb (55.3 kg)  ?Height: 5\' 5"  (1.651 m)  ?  ?  ?Body mass index is 20.3 kg/m?.  ?  ?Physical Exam:   ? ?General: awake, alert, and oriented no acute distress, nontoxic ?Skin: no suspicious lesions or rashes ?Neuro:sensation intact distally with no dificits, normal muscle tone, no atrophy, strength 5/5 in all tested lower ext groups ?Psych: normal mood and affect, speech clear ? ?Right Hip: ?No deformity, swelling or wasting ?ROM Flexion 90, ext 30, IR 45, ER 45 ?NTTP over the hip flexors, greater troch, glute musculature, si joint, lumbar spine ?Negative log roll with FROM ?Negative FABER ?Negative FADIR ?Negative Piriformis test ?Negative trendelenberg ?Gait normal  ? ? ?Electronically signed by:  ? D.Aleen Sells ?[facility] ?2:37 PM 02/03/22 ?

## 2022-02-03 ENCOUNTER — Ambulatory Visit (INDEPENDENT_AMBULATORY_CARE_PROVIDER_SITE_OTHER): Payer: 59

## 2022-02-03 ENCOUNTER — Ambulatory Visit: Payer: 59 | Admitting: Sports Medicine

## 2022-02-03 ENCOUNTER — Other Ambulatory Visit: Payer: Self-pay

## 2022-02-03 VITALS — BP 120/80 | HR 114 | Ht 65.0 in | Wt 122.0 lb

## 2022-02-03 DIAGNOSIS — M25551 Pain in right hip: Secondary | ICD-10-CM | POA: Diagnosis not present

## 2022-02-03 DIAGNOSIS — M24851 Other specific joint derangements of right hip, not elsewhere classified: Secondary | ICD-10-CM

## 2022-02-03 DIAGNOSIS — M419 Scoliosis, unspecified: Secondary | ICD-10-CM

## 2022-02-03 NOTE — Patient Instructions (Addendum)
Good to see you  ?Hip flexor HEP  ?As needed follow up  ?

## 2022-02-17 ENCOUNTER — Ambulatory Visit: Payer: 59 | Admitting: Family Medicine

## 2022-04-12 IMAGING — DX DG HIP (WITH OR WITHOUT PELVIS) 2-3V*R*
3 series · 3 of 3 positions shown · non-contrast
Comparison: None.

CLINICAL DATA: Right hip and groin pain.

EXAM:
DG HIP (WITH OR WITHOUT PELVIS) 2-3V RIGHT

[pelvis ap]
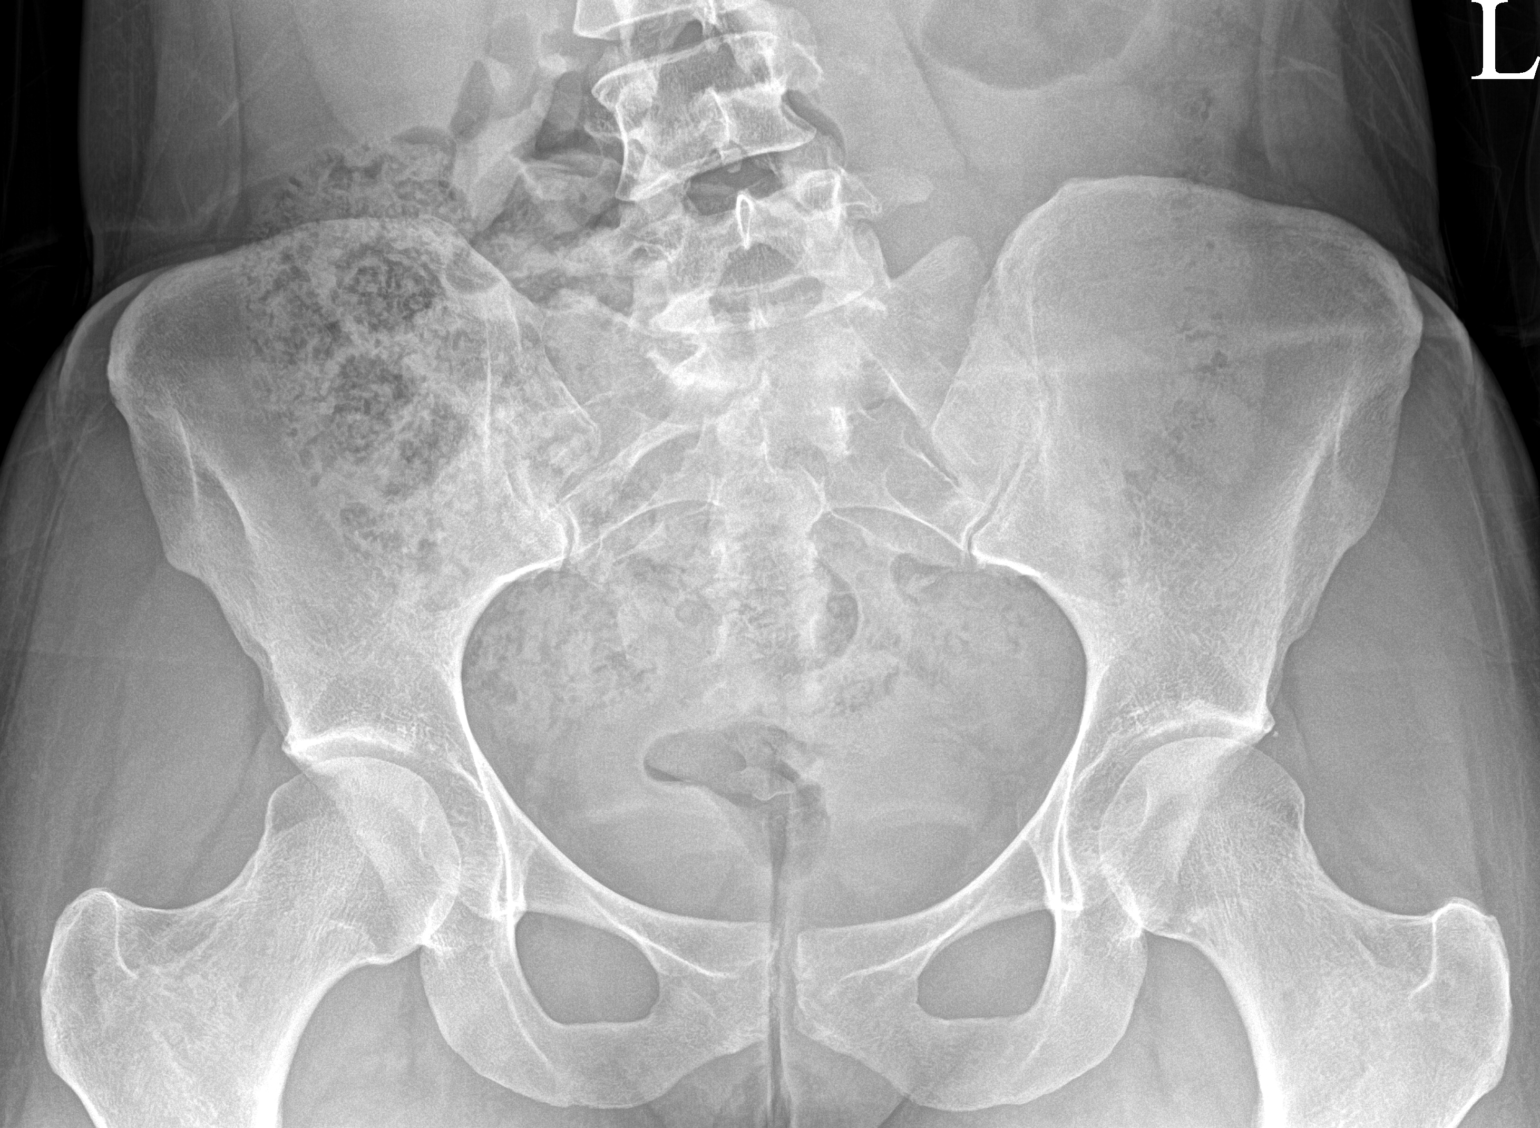

[hip ap]
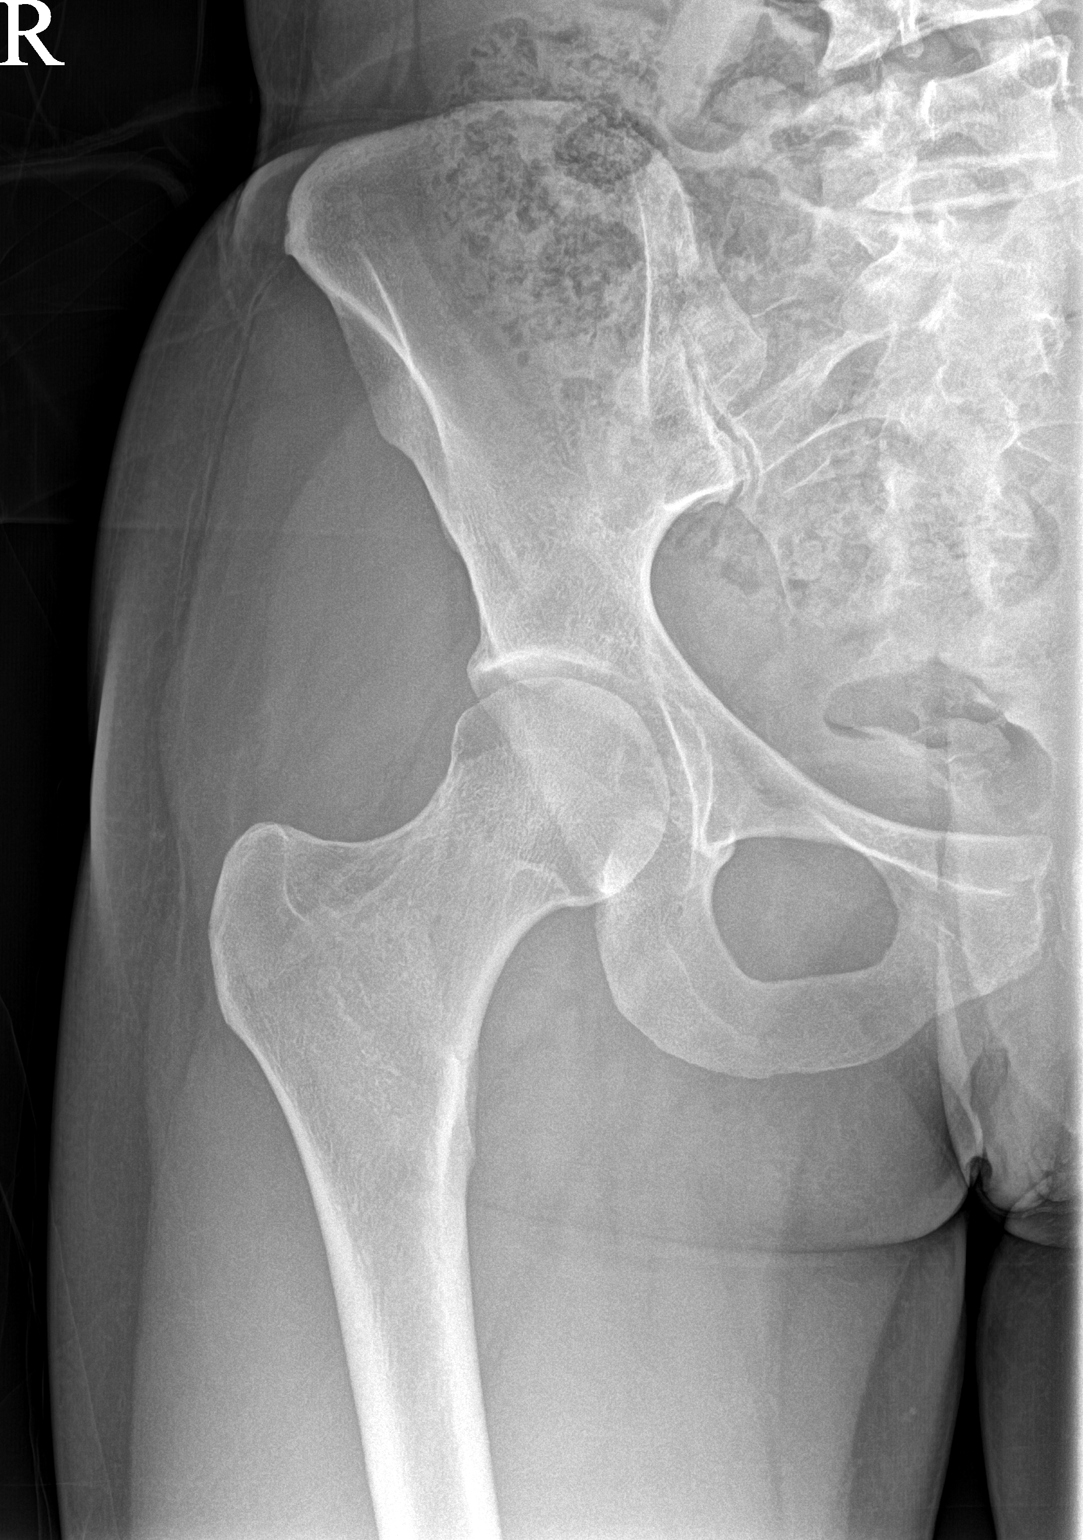

[hip frog leg]
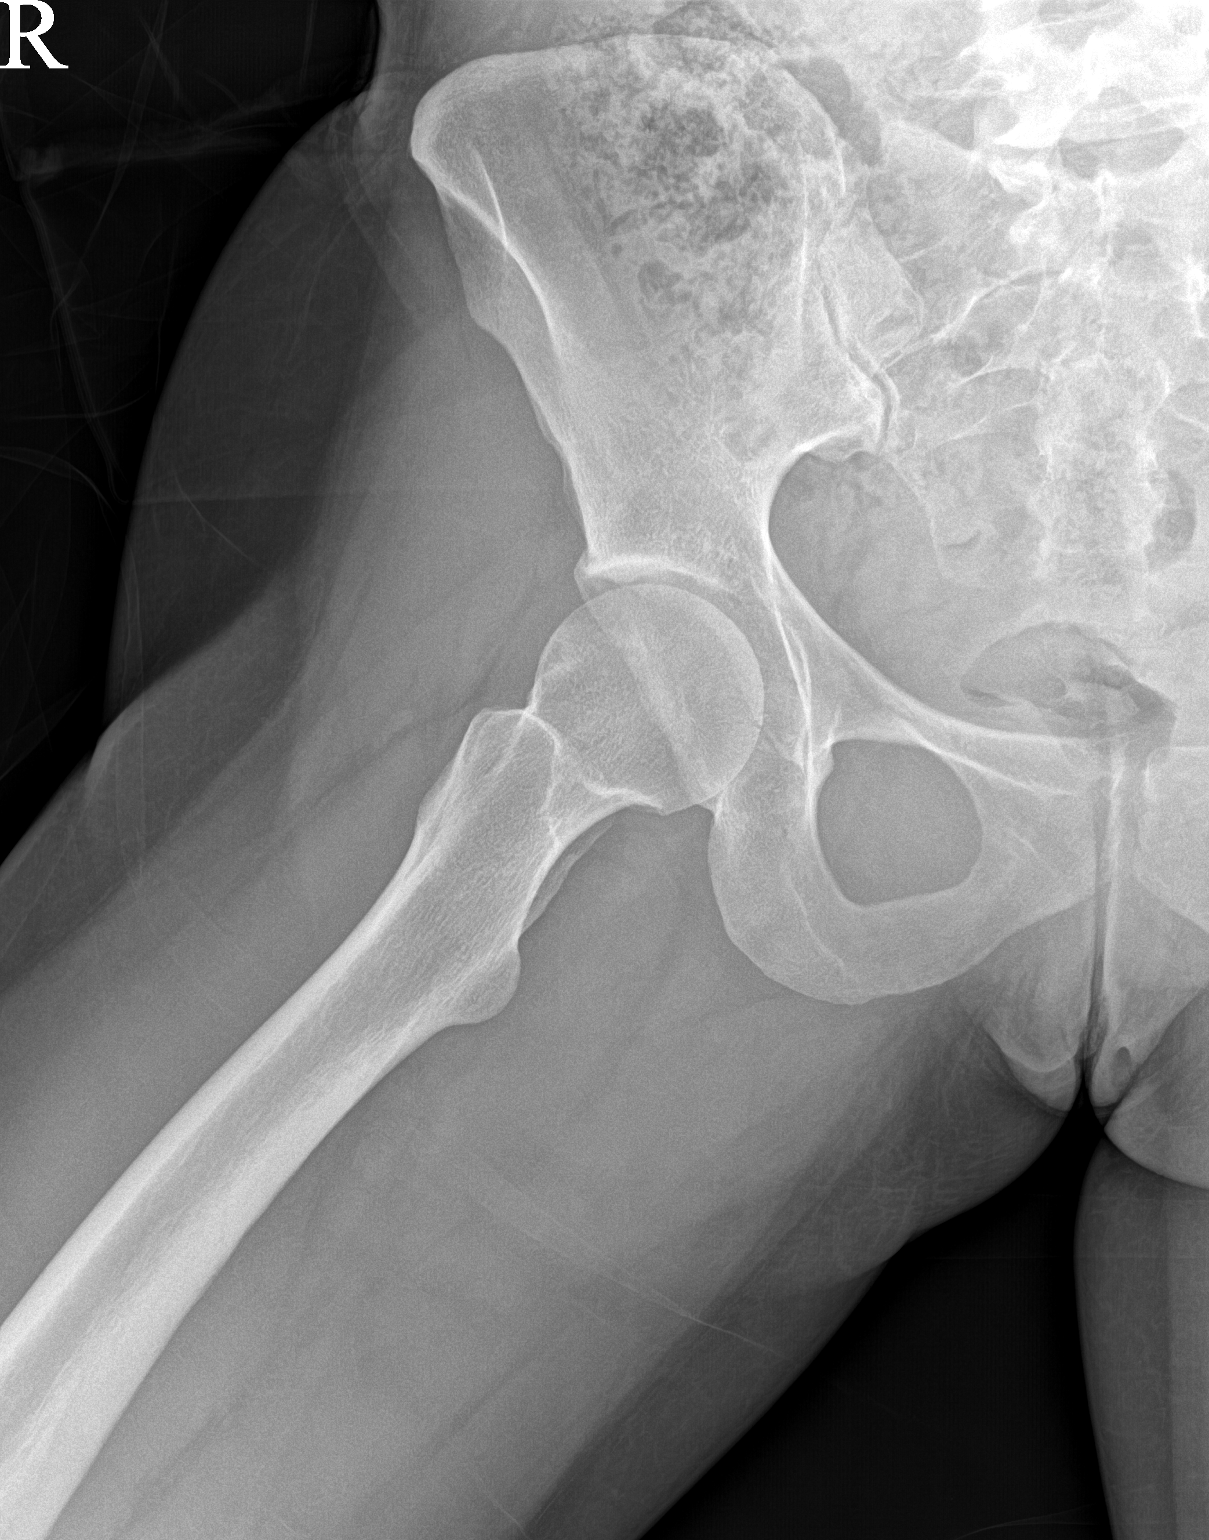

[3 of 3 positions shown; findings below may reference images not displayed]

FINDINGS: There is no evidence of hip fracture or dislocation. There is no
evidence of arthropathy or other focal bone abnormality. There is no
pelvic diastasis.
IMPRESSION: Negative.

## 2022-10-16 ENCOUNTER — Encounter: Payer: 59 | Admitting: Family Medicine

## 2022-11-09 ENCOUNTER — Ambulatory Visit (INDEPENDENT_AMBULATORY_CARE_PROVIDER_SITE_OTHER): Payer: BC Managed Care – PPO | Admitting: Family Medicine

## 2022-11-09 ENCOUNTER — Encounter: Payer: Self-pay | Admitting: Family Medicine

## 2022-11-09 VITALS — BP 124/84 | HR 130 | Temp 98.3°F | Resp 18 | Ht 65.0 in | Wt 123.0 lb

## 2022-11-09 DIAGNOSIS — Z0000 Encounter for general adult medical examination without abnormal findings: Secondary | ICD-10-CM

## 2022-11-09 DIAGNOSIS — M25562 Pain in left knee: Secondary | ICD-10-CM | POA: Diagnosis not present

## 2022-11-09 NOTE — Progress Notes (Signed)
Subjective:   By signing my name below, I, Haley Combs, attest that this documentation has been prepared under the direction and in the presence of Haley Combs, 11/09/2022.   Patient ID: Haley Combs, female    DOB: [birth year], 26 y.o.   MRN: [id number]  No chief complaint on file.   HPI Patient is in today for a comprehensive physical exam.  She denies new moles, itching, chills, fever, hearing loss, sinus pain, congestion, sore throat, cough and hemoptysis, chest pain, palpitations, wheezing, constipation, diarrhea, blood in stool, nausea and vomiting, dysuria, frequency, hematuria, myalgias and joint pain, depression, anxiety.  Left Knee Discomfort Patient reports left knee discomfort occurring a few months ago after injuring it at the gym. She expresses a tightness in the area that can travel up the lateral side of her thigh. She reports that she went to sports medicine and the discomfort went away. She didn't experience this for a couple of months in the summer, but after constantly handling numerous stairs during a trip, the discomfort reappeared. She has been experiencing this since. Patient plans on returning to sports medicine.  Pap smear- Patient states that she had a pap smear December 7 with Dr. Julien Girt at [facility].    Health Maintenance Due  Topic Date Due  . PAP-Cervical Cytology Screening  Never done  . INFLUENZA VACCINE  06/20/2022  . COVID-19 Vaccine (3 - 2023-24 season) 07/21/2022    Past Medical History:  Diagnosis Date  . Acne   . Asthma    Weather Induced heat/cold  . GERD (gastroesophageal reflux disease)   . Scoliosis     Past Surgical History:  Procedure Laterality Date  . TOOTH EXTRACTION    . TYMPANOSTOMY TUBE PLACEMENT      Family History  Problem Relation Age of Onset  . Hypertension Father        Not Medicated  . Hyperlipidemia Father   . Heart disease Father   . Alcohol abuse Sister   . Drug abuse Sister   . Dementia  Maternal Grandmother   . Hypertension Paternal Grandmother   . Hypertension Paternal Grandfather   . Stroke Paternal Grandfather   . Heart disease Paternal Grandfather        Pacemaker, Ruptured Aorta  . Alcohol abuse Paternal Uncle   . Mental illness Paternal Uncle        Schizophrania  . Colon cancer Neg Hx   . Colon polyps Neg Hx     Social History   Socioeconomic History  . Marital status: Single    Spouse name: Not on file  . Number of children: 0  . Years of education: Not on file  . Highest education level: Not on file  Occupational History  . Occupation: [employer]  Tobacco Use  . Smoking status: Never  . Smokeless tobacco: Never  Substance and Sexual Activity  . Alcohol use: Yes    Comment: occassional  . Drug use: No  . Sexual activity: Not Currently    Partners: Male    Birth control/protection: Condom, Pill  Other Topics Concern  . Not on file  Social History Narrative   Exercise-- y 3days a week   Rare caffeine    Social Determinants of Health   Financial Resource Strain: Not on file  Food Insecurity: Not on file  Transportation Needs: Not on file  Physical Activity: Not on file  Stress: Not on file  Social Connections: Not on file  Intimate Partner Violence:  Not on file    Outpatient Medications Prior to Visit  Medication Sig Dispense Refill  . albuterol (VENTOLIN HFA) 108 (90 Base) MCG/ACT inhaler INL 2 PFS PO Q 4 H PRN 1 each 2  . ALTAVERA 0.15-30 MG-MCG tablet Take 1 tablet by mouth daily.  0  . KETOCONAZOLE, TOPICAL, 1 % SHAM Apply topically.    . Vitamin D, Ergocalciferol, (DRISDOL) 1.25 MG (50000 UNIT) CAPS capsule TAKE 1 CAPSULE (50,000 UNITS TOTAL) BY MOUTH EVERY 7 (SEVEN) DAYS. 12 capsule 1   No facility-administered medications prior to visit.    No Known Allergies  Review of Systems  Constitutional:  Negative for chills and fever.  HENT:  Negative for congestion, sinus pain and sore throat.   Respiratory:  Negative for  cough, hemoptysis and wheezing.   Cardiovascular:  Negative for chest pain and palpitations.  Gastrointestinal:  Negative for blood in stool, constipation, diarrhea, nausea and vomiting.  Genitourinary:  Negative for dysuria, frequency and hematuria.  Musculoskeletal:  Negative for joint pain and myalgias.  Skin:  Negative for itching.       (-) new moles  Psychiatric/Behavioral:  Negative for depression. The patient is not nervous/anxious.        Objective:    Physical Exam Vitals and nursing note reviewed.  Constitutional:      General: She is not in acute distress.    Appearance: Normal appearance. She is not ill-appearing.  HENT:     Head: Normocephalic and atraumatic.     Right Ear: Tympanic membrane, ear canal and external ear normal.     Left Ear: Tympanic membrane, ear canal and external ear normal.  Eyes:     Extraocular Movements: Extraocular movements intact.     Pupils: Pupils are equal, round, and reactive to light.  Cardiovascular:     Rate and Rhythm: Normal rate and regular rhythm.     Heart sounds: Normal heart sounds. No murmur heard.    No gallop.  Pulmonary:     Effort: Pulmonary effort is normal. No respiratory distress.     Breath sounds: Normal breath sounds. No wheezing or rales.  Abdominal:     General: Bowel sounds are normal. There is no distension.     Palpations: Abdomen is soft.     Tenderness: There is no abdominal tenderness. There is no guarding.  Skin:    General: Skin is warm and dry.  Neurological:     General: No focal deficit present.     Mental Status: She is alert and oriented to person, place, and time.  Psychiatric:        Mood and Affect: Mood normal.        Behavior: Behavior normal.        Thought Content: Thought content normal.        Judgment: Judgment normal.    There were no vitals taken for this visit. Wt Readings from Last 3 Encounters:  02/03/22 122 lb (55.3 kg)  10/11/21 122 lb 9.6 oz (55.6 kg)  10/08/20 128 lb  3.2 oz (58.2 kg)       Assessment & Plan:   Problem List Items Addressed This Visit   None  No orders of the defined types were placed in this encounter.   I, Seabron Spates, personally preformed the services described in this documentation.  All medical record entries made by the scribe were at my direction and in my presence.  I have reviewed the chart and discharge instructions (if  applicable) and agree that the record reflects my personal performance and is accurate and complete. 11/09/2022.   I,Verona Buck,acting as a Education administrator for [employer], DO.,have documented all relevant documentation on the behalf of Ann Held, DO,as directed by  Ann Held, DO while in the presence of Ann Held, DO.    Haley Combs

## 2023-11-19 ENCOUNTER — Encounter: Payer: BC Managed Care – PPO | Admitting: Family Medicine

## 2024-07-31 ENCOUNTER — Other Ambulatory Visit: Payer: Self-pay

## 2024-07-31 ENCOUNTER — Emergency Department (HOSPITAL_COMMUNITY)

## 2024-07-31 ENCOUNTER — Emergency Department (HOSPITAL_COMMUNITY)
Admission: EM | Admit: 2024-07-31 | Discharge: 2024-07-31 | Disposition: A | Discharge status: Home / Self Care | Attending: Emergency Medicine | Admitting: Emergency Medicine

## 2024-07-31 ENCOUNTER — Ambulatory Visit: Payer: Self-pay

## 2024-07-31 ENCOUNTER — Encounter (HOSPITAL_COMMUNITY): Payer: Self-pay

## 2024-07-31 DIAGNOSIS — M79662 Pain in left lower leg: Secondary | ICD-10-CM | POA: Diagnosis not present

## 2024-07-31 DIAGNOSIS — J45909 Unspecified asthma, uncomplicated: Secondary | ICD-10-CM | POA: Insufficient documentation

## 2024-07-31 DIAGNOSIS — M79605 Pain in left leg: Secondary | ICD-10-CM | POA: Diagnosis present

## 2024-07-31 LAB — CBC
HCT: 45.4 % (ref 36.0–46.0)
Hemoglobin: 14.7 g/dL (ref 12.0–15.0)
MCH: 29.6 pg (ref 26.0–34.0)
MCHC: 32.4 g/dL (ref 30.0–36.0)
MCV: 91.3 fL (ref 80.0–100.0)
Platelets: 336 K/uL (ref 150–400)
RBC: 4.97 MIL/uL (ref 3.87–5.11)
RDW: 12.4 % (ref 11.5–15.5)
WBC: 10.2 K/uL (ref 4.0–10.5)
nRBC: 0 % (ref 0.0–0.2)

## 2024-07-31 LAB — BASIC METABOLIC PANEL WITH GFR
Anion gap: 15 (ref 5–15)
BUN: 7 mg/dL (ref 6–20)
CO2: 20 mmol/L — ABNORMAL LOW (ref 22–32)
Calcium: 9.6 mg/dL (ref 8.9–10.3)
Chloride: 105 mmol/L (ref 98–111)
Creatinine, Ser: 0.65 mg/dL (ref 0.44–1.00)
GFR, Estimated: >60 mL/min (ref >60)
Glucose, Bld: 100 mg/dL — ABNORMAL HIGH (ref 70–99)
Potassium: 3.8 mmol/L (ref 3.5–5.1)
Sodium: 140 mmol/L (ref 135–145)

## 2024-07-31 NOTE — Telephone Encounter (Signed)
 FYI Only or Action Required?: Action required by provider: advised ED.  Patient was last seen in primary care on 11/09/2022 by Antonio Meth, Jamee SAUNDERS, DO.  Called Nurse Triage reporting Leg Pain.  Symptoms began a week ago.  Interventions attempted: OTC medications: aspirin, pain reliever.  Symptoms are: gradually worsening.  Triage Disposition: See HCP Within 4 Hours (Or PCP Triage)  Patient/caregiver understands and will follow disposition?: Yes     Copied from CRM #[id number]. Topic: Clinical - Red Word Triage >> Jul 31, 2024 10:05 AM Burnard DEL wrote: Red Word that prompted transfer to Nurse Triage: skin discoloration in left calf,cramping in leg Reason for Disposition  [1] Thigh or calf pain AND [2] only 1 side AND [3] present > 1 hour  (Exception: Chronic unchanged pain.)    Nurse judgement, advised ED  Answer Assessment - Initial Assessment Questions Advised ED for further evaluation. Patient reports going to ED now.   1. ONSET: When did the pain start?      Last Thursday 2. LOCATION: Where is the pain located?      Left leg pain 3. PAIN: How bad is the pain?    (Scale 1-10; or mild, moderate, severe)     Cramping does not go away, it can get intense, Twitching in leg, Not warm to touch, not more swollen than right and not swollen at all,  5. CAUSE: What do you think is causing the leg pain?    3 weeks ago Train ride, noticed tiny red mark, now size of coffee mug, red, inflamed, but now looks like a bruise.  No open wounds, did not see any ticks or bugs and was not bullseye rash.  6. OTHER SYMPTOMS: Do you have any other symptoms? (e.g., chest pain, back pain, breathing difficulty, swelling, rash, fever, numbness, weakness) Denies breathing difficulty, muscle aches, fever, chills, n/v, weakness/ numbness  Took aspirin and pain relivers,  Protocols used: Leg Pain-A-AH

## 2024-07-31 NOTE — ED Triage Notes (Signed)
 Pt presents to ED from home C/O spot on L leg that is associated with a deep achy pain. Pt reports she noticed the pain after she [place] a train [place] out of [place], and it has not gone away.

## 2024-07-31 NOTE — Discharge Instructions (Signed)
 As we discussed, your workup in the ER today was reassuring for acute findings.  Ultrasound imaging of your leg and laboratory evaluation did not reveal any emergent concerns.  I suspect that you sustained a muscle strain on your long train ride that was likely exacerbated by the long car ride you took yesterday.  I recommend that you rest, ice, compress, and elevate your leg and take Tylenol/ibuprofen as needed for pain.  Please use acetaminophen (Tylenol) or ibuprofen (Advil, Motrin) for pain.  You may use 800 mg ibuprofen every 6 hours or 1000 mg of acetaminophen every 6 hours.  You may choose to alternate between the two, this would be most effective. Do not exceed 4000 mg of acetaminophen within 24 hours.  Do not exceed 3200 mg ibuprofen within 24 hours.

## 2024-07-31 NOTE — ED Provider Notes (Signed)
 [place] EMERGENCY DEPARTMENT AT [facility] Provider Note   CSN: [id number] Arrival date & time: 07/31/24  1115     Patient presents with: Leg Pain   Haley Combs is a 28 y.o. female.   Patient with history of asthma, GERD, scoliosis presents today with complaints of left leg pain. She reports that symptoms began 3 weeks ago when she sat on a train for 10 hours to [place] . Reports that she thinks she had the back of her left calf pressed against the seat for most of the trip, and after she got off the train she noticed she had a small red mark on the area that was tender. Reports that after that the mark became progressively larger to the size of a coffee cup and continued to be tender. Reports that the mark has gone away, however she continues to have pain. Reports she had an extended car ride yesterday which seemed to make it worse again. Has been taking aspirin with some improvement. Pain is not worse with walking or movement. Does report that she has a history of a knee injury previously and thought she could've exacerbated her old injury, however now she isn't sure. Denies fevers or chills. No chest pain or shortness of breath. She is not on OCPs.   The history is provided by the patient. No language interpreter was used.  Leg Pain      Prior to Admission medications   Medication Sig Start Date End Date Taking? Authorizing Provider  albuterol  (VENTOLIN  HFA) 108 (90 Base) MCG/ACT inhaler INL 2 PFS PO Q 4 H PRN 10/11/21   Antonio Meth, Yvonne R, DO  ALTAVERA 0.15-30 MG-MCG tablet Take 1 tablet by mouth daily. 12/20/17   [provider]  KETOCONAZOLE, TOPICAL, 1 % SHAM Apply topically.    [provider]    Allergies: Patient has no known allergies.    Review of Systems  All other systems reviewed and are negative.   Updated Vital Signs BP (!) 138/101 (BP Location: Left Arm)   Pulse (!) 117   Temp 98.7 F (37.1 C) (Oral)   Resp 20   Ht 5' 5  (1.651 m)   Wt 54 kg   LMP 07/01/2024 (Approximate)   SpO2 99%   BMI 19.80 kg/m   Physical Exam Vitals and nursing note reviewed.  Constitutional:      General: She is not in acute distress.    Appearance: Normal appearance. She is normal weight. She is not ill-appearing, toxic-appearing or diaphoretic.  HENT:     Head: Normocephalic and atraumatic.  Cardiovascular:     Rate and Rhythm: Normal rate.  Pulmonary:     Effort: Pulmonary effort is normal. No respiratory distress.  Musculoskeletal:        General: Normal range of motion.     Cervical back: Normal range of motion.     Comments: LLE without tenderness to palpation. No swelling, erythema, warmth, fluctuance, or induration. DP and PT pulses intact and 2+. ROM intact without pain. No joint swelling. Negative Homans sign. No palpable cord. No rash or skin changes  Skin:    General: Skin is warm and dry.  Neurological:     General: No focal deficit present.     Mental Status: She is alert.  Psychiatric:        Mood and Affect: Mood normal.        Behavior: Behavior normal.     (all labs ordered are listed,  but only abnormal results are displayed) Labs Reviewed  BASIC METABOLIC PANEL WITH GFR - Abnormal; Notable for the following components:      Result Value   CO2 20 (*)    Glucose, Bld 100 (*)    All other components within normal limits  CBC    EKG: None  Radiology: VAS US  LOWER EXTREMITY VENOUS (DVT) (7a-7p) Result Date: 07/31/2024  Lower Venous DVT Study Patient Name:  Haley Combs  Date of Exam:   07/31/2024 Medical Rec #: [id number]   Accession #:    [id number] Date of Birth: [birth year]    Patient Gender: F Patient Age:   55 years Exam Location:  [facility] Procedure:      VAS US  LOWER EXTREMITY VENOUS (DVT) Referring Phys: [facility] Lavan Imes --------------------------------------------------------------------------------  Indications: Pain, and recent long distance travel.  Comparison Study: Previosu study of  thr rigth lower exrrmity on 4.22.2016. Performing Technologist: Edilia Elden Appl  Examination Guidelines: A complete evaluation includes B-mode imaging, spectral Doppler, color Doppler, and power Doppler as needed of all accessible portions of each vessel. Bilateral testing is considered an integral part of a complete examination. Limited examinations for reoccurring indications may be performed as noted. The reflux portion of the exam is performed with the patient in reverse Trendelenburg.  +-----+---------------+---------+-----------+----------+--------------+ RIGHTCompressibilityPhasicitySpontaneityPropertiesThrombus Aging +-----+---------------+---------+-----------+----------+--------------+ CFV  Full           Yes      Yes                                 +-----+---------------+---------+-----------+----------+--------------+ SFJ  Full           Yes      Yes                                 +-----+---------------+---------+-----------+----------+--------------+   +---------+---------------+---------+-----------+----------+--------------+ LEFT     CompressibilityPhasicitySpontaneityPropertiesThrombus Aging +---------+---------------+---------+-----------+----------+--------------+ CFV      Full           Yes      Yes                                 +---------+---------------+---------+-----------+----------+--------------+ SFJ      Full           Yes      Yes                                 +---------+---------------+---------+-----------+----------+--------------+ FV Prox  Full                                                        +---------+---------------+---------+-----------+----------+--------------+ FV Mid   Full                                                        +---------+---------------+---------+-----------+----------+--------------+ FV DistalFull                                                         +---------+---------------+---------+-----------+----------+--------------+  PFV      Full                                                        +---------+---------------+---------+-----------+----------+--------------+ POP      Full           Yes      Yes                                 +---------+---------------+---------+-----------+----------+--------------+ PTV      Full                                                        +---------+---------------+---------+-----------+----------+--------------+ PERO     Full                                                        +---------+---------------+---------+-----------+----------+--------------+     Summary: RIGHT: - No evidence of common femoral vein obstruction.   LEFT: - There is no evidence of deep vein thrombosis in the lower extremity.  - No cystic structure found in the popliteal fossa.  *See table(s) above for measurements and observations.    Preliminary      Procedures   Medications Ordered in the ED - No data to display                                  Medical Decision Making Amount and/or Complexity of Data Reviewed Labs: ordered.   This patient is a 28 y.o. female who presents to the ED for concern of left leg pain, this involves an extensive number of treatment options, and is a complaint that carries with it a high risk of complications and morbidity. The emergent differential diagnosis prior to evaluation includes, but is not limited to,  muscle strain, DVT . This is not an exhaustive differential.   Past Medical History / Co-morbidities / Social History:  has a past medical history of Acne, Asthma, GERD (gastroesophageal reflux disease), and Scoliosis.  Additional history: Chart reviewed.   Physical Exam: Physical exam performed. The pertinent findings include:   LLE without tenderness to palpation. No swelling, erythema, warmth, fluctuance, or induration. DP and PT pulses intact and 2+. ROM  intact without pain. No joint swelling. Negative Homans sign. No palpable cord. No rash or skin changes  Lab Tests: I ordered, and personally interpreted labs.  The pertinent results include:  No acute laboratory abnormalities   Imaging Studies: I ordered imaging studies including DVT US . I independently visualized and interpreted imaging which showed   - There is no evidence of deep vein thrombosis in the lower extremity.    - No cystic structure found in the popliteal fossa.   I agree with the radiologist interpretation.  Disposition: After consideration of the diagnostic results and the patients response to treatment, I feel that emergency department workup  does not suggest an emergent condition requiring admission or immediate intervention beyond what has been performed at this time. The plan is: Discharge with close outpatient follow-up and return precautions.  Patient's workup is benign.  She has no signs or symptoms to suggest cellulitis or septic arthritis or any other infectious etiology of symptoms. Suspect she had a muscle strain from the position she was sitting in on the train for 10 hours which was exacerbated by her long car ride yesterday.  Discussed same with patient is understanding and in agreement with this.  Recommend RICE and Tylenol/ibuprofen for symptoms with close outpatient follow-up and return precautions. Evaluation and diagnostic testing in the emergency department does not suggest an emergent condition requiring admission or immediate intervention beyond what has been performed at this time.  Plan for discharge with close PCP follow-up.  Patient is understanding and amenable with plan, educated on red flag symptoms that would prompt immediate return.  Patient discharged in stable condition.  Final diagnoses:  Left leg pain    ED Discharge Orders     None     An After Visit Summary was printed and given to the patient.      Nora Lauraine LABOR, PA-C 07/31/24  1514    Elnor Jayson LABOR, DO 07/31/24 1544

## 2024-07-31 NOTE — Telephone Encounter (Signed)
FYI. Pt going to ED.  

## 2024-07-31 NOTE — ED Notes (Signed)
 [place] in room
